# Patient Record
Sex: Female | Born: 1970 | Race: White | Hispanic: Yes | Marital: Married | Smoking: Never smoker
Health system: Southern US, Community
[De-identification: ages and names within clinical notes are randomized; demographics above are authoritative.]

## PROBLEM LIST (undated history)

## (undated) DIAGNOSIS — K589 Irritable bowel syndrome without diarrhea: Secondary | ICD-10-CM

## (undated) DIAGNOSIS — H9319 Tinnitus, unspecified ear: Secondary | ICD-10-CM

## (undated) DIAGNOSIS — E559 Vitamin D deficiency, unspecified: Secondary | ICD-10-CM

## (undated) DIAGNOSIS — M25512 Pain in left shoulder: Secondary | ICD-10-CM

## (undated) DIAGNOSIS — M6530 Trigger finger, unspecified finger: Secondary | ICD-10-CM

## (undated) DIAGNOSIS — M3500 Sicca syndrome, unspecified: Secondary | ICD-10-CM

## (undated) DIAGNOSIS — M542 Cervicalgia: Secondary | ICD-10-CM

## (undated) DIAGNOSIS — F0781 Postconcussional syndrome: Secondary | ICD-10-CM

## (undated) DIAGNOSIS — M329 Systemic lupus erythematosus, unspecified: Secondary | ICD-10-CM

## (undated) DIAGNOSIS — E538 Deficiency of other specified B group vitamins: Secondary | ICD-10-CM

## (undated) DIAGNOSIS — R4586 Emotional lability: Secondary | ICD-10-CM

## (undated) DIAGNOSIS — G8929 Other chronic pain: Secondary | ICD-10-CM

## (undated) DIAGNOSIS — K219 Gastro-esophageal reflux disease without esophagitis: Secondary | ICD-10-CM

## (undated) DIAGNOSIS — Z973 Presence of spectacles and contact lenses: Secondary | ICD-10-CM

## (undated) DIAGNOSIS — J189 Pneumonia, unspecified organism: Secondary | ICD-10-CM

## (undated) DIAGNOSIS — M797 Fibromyalgia: Secondary | ICD-10-CM

## (undated) DIAGNOSIS — R519 Headache, unspecified: Secondary | ICD-10-CM

## (undated) DIAGNOSIS — IMO0002 Reserved for concepts with insufficient information to code with codable children: Secondary | ICD-10-CM

## (undated) HISTORY — PX: COLONOSCOPY: SHX174

## (undated) HISTORY — PX: UPPER GASTROINTESTINAL ENDOSCOPY: SHX188

## (undated) HISTORY — DX: Systemic lupus erythematosus, unspecified: M32.9

## (undated) HISTORY — DX: Fibromyalgia: M79.7

## (undated) HISTORY — DX: Sjogren syndrome, unspecified: M35.00

## (undated) HISTORY — PX: CHOLECYSTECTOMY: SHX55

## (undated) HISTORY — PX: APPENDECTOMY: SHX54

## (undated) HISTORY — DX: Reserved for concepts with insufficient information to code with codable children: IMO0002

## (undated) HISTORY — PX: GALLBLADDER SURGERY: SHX652

---
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
## 2004-07-05 ENCOUNTER — Ambulatory Visit (HOSPITAL_COMMUNITY): Admission: RE | Admit: 2004-07-05 | Discharge: 2004-07-05 | Payer: Self-pay | Admitting: Internal Medicine

## 2004-07-09 ENCOUNTER — Ambulatory Visit (HOSPITAL_COMMUNITY): Admission: RE | Admit: 2004-07-09 | Discharge: 2004-07-09 | Payer: Self-pay | Admitting: Internal Medicine

## 2005-01-24 ENCOUNTER — Ambulatory Visit: Payer: Self-pay | Admitting: Internal Medicine

## 2005-03-03 ENCOUNTER — Ambulatory Visit: Payer: Self-pay | Admitting: Internal Medicine

## 2005-04-27 ENCOUNTER — Ambulatory Visit: Payer: Self-pay | Admitting: Internal Medicine

## 2005-05-11 ENCOUNTER — Ambulatory Visit: Payer: Self-pay | Admitting: Internal Medicine

## 2008-03-11 ENCOUNTER — Other Ambulatory Visit: Admission: RE | Admit: 2008-03-11 | Discharge: 2008-03-11 | Payer: Self-pay | Admitting: Obstetrics and Gynecology

## 2010-12-29 ENCOUNTER — Other Ambulatory Visit: Payer: Self-pay | Admitting: Obstetrics and Gynecology

## 2010-12-29 ENCOUNTER — Other Ambulatory Visit (HOSPITAL_COMMUNITY)
Admission: RE | Admit: 2010-12-29 | Discharge: 2010-12-29 | Disposition: A | Discharge status: Home / Self Care | Payer: BC Managed Care – PPO | Source: Ambulatory Visit | Attending: Obstetrics and Gynecology | Admitting: Obstetrics and Gynecology

## 2010-12-29 DIAGNOSIS — Z01419 Encounter for gynecological examination (general) (routine) without abnormal findings: Secondary | ICD-10-CM | POA: Insufficient documentation

## 2011-04-15 NOTE — Consult Note (Signed)
NAMEMALICIA, [name]:  [id number]   MEDICAL RECORD NO.:  [id number]                  PATIENT TYPE:   LOCATION:                                       FACILITY:   PHYSICIAN:  R. Roetta Sessions, M.D.              DATE OF BIRTH:  11-05-1971   DATE OF CONSULTATION:  07/02/2004  DATE OF DISCHARGE:                                   CONSULTATION   REFERRING PHYSICIAN:  Dr. Wyvonnia Lora.   REASON FOR CONSULTATION:  Epigastric pain.   HISTORY OF PRESENT ILLNESS:  The patient is a 40 year old female who  presents today for further evaluation of chronic intermittent upper  abdominal pain.  She states pain began about four years ago when she was  living in [place].  She was diagnosed with gastritis and duodenitis by EGD per  her report.  She was also told she had H. pylori and underwent treatment  with Pepto-Bismol combination therapy.  She says that this did not really  help.  She continues to have intermittent episodes of epigastric pain.  Over  the last couple of months the pain has been more persistent.  She has pain  in the epigastric region, which she describes as a burning type pain.  It is  worse postprandially, especially with greasy foods.  She feels like her  abdomen swells at times.  She denies any typical heartburn symptoms.  She  has developed some nausea, but no vomiting.  She complains of early satiety.  She has chronic constipation; generally has a bowel movement every two to  three days.  Currently she is not taking any laxatives, although she has  tried different things in the past.  She denies any melena or rectal  bleeding.  She complains of feeling hot in her joints, especially elbows,  wrists, knees and ankles when she develops this abdominal pain.  She  recently was retreated for H. pylori, given positive H. pylori antibody  test.  It was felt that maybe she had inadequate treatment with the first  round four years ago.  She took a 10  day course of Prevpac.  Gallbladder  ultrasound was unremarkable.   CURRENT MEDICATIONS:  None.   ALLERGIES:  SOMETHING USED TO TREAT H. PYLORI FOUR YEARS AGO.   PAST MEDICAL HISTORY:  1. Negative for chronic illnesses.  2. Status post appendectomy.   FAMILY HISTORY:  Father has history of ulcer.  No family history of  colorectal cancer or inflammatory bowel disease.   SOCIAL HISTORY:  She has been married for five years and has a three-year-  old daughter.  She is employed with [employer].  She  has never been a smoker.  Denies any alcohol use.   REVIEW OF SYSTEMS:  Please see HPI for GI.  CARDIOPULMONARY:  Denies any  shortness of breath or chest pain.  GENERAL:  Complains of generalized  fatigue.   PHYSICAL EXAMINATION:  VITAL SIGNS:  Weight 115; height 5 feet 2 inches;  blood pressure 100/60; pulse 72.  GENERAL:  Pleasant, well-nourished, well-developed female in no acute  distress.  SKIN:  Warm and dry; no jaundice.  HEENT:  Conjunctivae are pink; sclerae nonicteric; oropharyngeal mucosa  moist and pink; no lesions, erythema or exudate; no lymphadenopathy,  thyromegaly.  CHEST:  Lungs clear to auscultation.  CARDIAC:  Regular rate and rhythm.  Normal S1, S2.  No murmurs, rubs or  gallops.  ABDOMEN:  Positive bowel sounds.  Soft, nondistended.  She has some moderate  epigastric tenderness to deep palpation.  No organomegaly or masses.  No  rebound tenderness or guarding.  EXTREMITIES:  No edema.   IMPRESSION:  A 40 year old female with chronic epigastric pain, which has  been worse in the last few months.  She has a significant postprandial  component as well, especially to greasy foods.  She is describing nausea and  early satiety.  Symptoms are worrisome for peptic ulcer disease, although I  cannot rule out gastritis, gastroesophageal reflux disease, or even biliary  etiology, such as __________ or dyskinesia.  She has chronic constipation,   currently not on any therapy.   The patient complains of multiple joint soreness, which is intermittent and  she correlates with her abdominal pain.  I discussed with her today that if  this does not improve, especially with treatment of her GI symptoms, would  recommend her to revisit this with Dr. Margo Common and possibly a rheumatological  evaluation.   PLAN:  1. EGD in the near future.  2. Nexium 40 mg daily.  3. FiberChoice two tablets daily.  4. MiraLax 17 grams p.o. q.d. p.r.n. No. 527 grams with one refill given.  5. Further recommendations to follow.     ____________________  R. Roetta Sessions, M.D.     ____________________  Tana Coast, P.A.     ________________________________________  ___________________________________________  Tana Coast, P.A.                         Jonathon Bellows, M.D.   LL/MEDQ  D:  07/02/2004  T:  07/02/2004  Job:  [id number]   cc:   R. Roetta Sessions, M.D.  [address]  [place]  [place] [address]  Fax: [phone]   Wyvonnia Lora  [address]  [place]  [place] [address]  Fax: [phone]

## 2011-04-15 NOTE — Op Note (Signed)
NAME:  Gail [name], Gail [name]                         ACCOUNT NO.:  [id number]   MEDICAL RECORD NO.:  [id number]                   PATIENT TYPE:  AMB   LOCATION:  DAY                                  FACILITY:  [facility]   PHYSICIAN:  R. Roetta Sessions, M.D.              DATE OF BIRTH:  06-27-1971   DATE OF PROCEDURE:  07/05/2004  DATE OF DISCHARGE:                                 OPERATIVE REPORT   PROCEDURE:  Diagnostic esophagogastroduodenoscopy.   INDICATIONS FOR PROCEDURE:  The patient is a 40 year old lady with chronic  epigastric pain and there is a postprandial component.  She was recently  started on fiber of choice, MiraLax and Nexium empirically.  She is  chronically constipated and EGD is now being done.  This approach has been  discussed with the patient at length.  The potential risks, benefits, and  alternatives have been reviewed and questions answered.  Please see  documentation in the medical record for more information.   PROCEDURE NOTE:  O2 saturation, blood pressure, pulse, and respirations were  monitored throughout the entire procedure.   CONSCIOUS SEDATION:  Versed 3 mg IV and Demerol 75 mg IV in divided doses.   INSTRUMENT:  Olympus video chip system.   FINDINGS:  Examination of the tubular esophagus revealed no mucosal  abnormalities.  The EG junction was easily traversed.   Stomach:  The gastric cavity was empty and insufflated well with air and a  thorough examination of the gastric mucosa, including a retroflex view of  the proximal stomach and esophagogastric junction demonstrated no  abnormalities.  The pylorus was patent and easily traversed.  Examination of  the bulb and second portion revealed no abnormalities.   THERAPEUTIC/DIAGNOSTIC MANEUVERS PERFORMED:  None.   The patient tolerated the procedure well and was reacted after endoscopy.   IMPRESSION:  Normal esophagus, stomach and D1/D2.   RECOMMENDATIONS:  1. Continue Nexium, MiraLax and fiber of  choice for the time being.  2. We will proceed with a gallbladder ultrasound to further evaluate this     lady for potential occult gallbladder disease.  3. Further recommendations to follow.      ___________________________________________                                            Jonathon Bellows, M.D.   RMR/MEDQ  D:  07/05/2004  T:  07/05/2004  Job:  [id number]   cc:   Wyvonnia Lora  [address]  [place]  [place] [address]  Fax: [phone]

## 2012-02-22 ENCOUNTER — Other Ambulatory Visit: Payer: Self-pay | Admitting: Obstetrics and Gynecology

## 2012-02-22 ENCOUNTER — Other Ambulatory Visit (HOSPITAL_COMMUNITY)
Admission: RE | Admit: 2012-02-22 | Discharge: 2012-02-22 | Disposition: A | Discharge status: Home / Self Care | Payer: BC Managed Care – PPO | Source: Ambulatory Visit | Attending: Obstetrics and Gynecology | Admitting: Obstetrics and Gynecology

## 2012-02-22 DIAGNOSIS — Z01419 Encounter for gynecological examination (general) (routine) without abnormal findings: Secondary | ICD-10-CM | POA: Insufficient documentation

## 2012-05-10 ENCOUNTER — Ambulatory Visit: Payer: BC Managed Care – PPO | Admitting: Family Medicine

## 2012-05-21 ENCOUNTER — Ambulatory Visit: Payer: BC Managed Care – PPO | Admitting: Family Medicine

## 2012-09-04 ENCOUNTER — Ambulatory Visit: Payer: BC Managed Care – PPO | Admitting: Family Medicine

## 2012-10-08 ENCOUNTER — Ambulatory Visit: Payer: BC Managed Care – PPO | Admitting: Family Medicine

## 2013-04-18 ENCOUNTER — Other Ambulatory Visit: Payer: Self-pay | Admitting: Obstetrics and Gynecology

## 2013-05-03 ENCOUNTER — Encounter: Payer: Self-pay | Admitting: *Deleted

## 2013-05-06 ENCOUNTER — Other Ambulatory Visit (HOSPITAL_COMMUNITY)
Admission: RE | Admit: 2013-05-06 | Discharge: 2013-05-06 | Disposition: A | Discharge status: Home / Self Care | Payer: BC Managed Care – PPO | Source: Ambulatory Visit | Attending: Obstetrics and Gynecology | Admitting: Obstetrics and Gynecology

## 2013-05-06 ENCOUNTER — Ambulatory Visit (INDEPENDENT_AMBULATORY_CARE_PROVIDER_SITE_OTHER): Payer: BC Managed Care – PPO | Admitting: Obstetrics and Gynecology

## 2013-05-06 ENCOUNTER — Encounter: Payer: Self-pay | Admitting: Obstetrics and Gynecology

## 2013-05-06 VITALS — BP 104/70 | Ht 60.5 in | Wt 116.2 lb

## 2013-05-06 DIAGNOSIS — Z01419 Encounter for gynecological examination (general) (routine) without abnormal findings: Secondary | ICD-10-CM

## 2013-05-06 DIAGNOSIS — Z113 Encounter for screening for infections with a predominantly sexual mode of transmission: Secondary | ICD-10-CM | POA: Insufficient documentation

## 2013-05-06 DIAGNOSIS — N760 Acute vaginitis: Secondary | ICD-10-CM | POA: Insufficient documentation

## 2013-05-06 DIAGNOSIS — B9689 Other specified bacterial agents as the cause of diseases classified elsewhere: Secondary | ICD-10-CM

## 2013-05-06 DIAGNOSIS — R319 Hematuria, unspecified: Secondary | ICD-10-CM

## 2013-05-06 DIAGNOSIS — Z1151 Encounter for screening for human papillomavirus (HPV): Secondary | ICD-10-CM | POA: Insufficient documentation

## 2013-05-06 LAB — POCT URINALYSIS DIPSTICK
Glucose, UA: NEGATIVE
Ketones, UA: NEGATIVE
Leukocytes, UA: NEGATIVE

## 2013-05-06 MED ORDER — METRONIDAZOLE 0.75 % VA GEL: 1.0000 | Freq: Every day | VAGINAL | Status: DC

## 2013-05-06 NOTE — Patient Instructions (Addendum)
Bacterial Vaginosis Bacterial vaginosis (BV) is a vaginal infection where the normal balance of bacteria in the vagina is disrupted. The normal balance is then replaced by an overgrowth of certain bacteria. There are several different kinds of bacteria that can cause BV. BV is the most common vaginal infection in women of childbearing age. CAUSES   The cause of BV is not fully understood. BV develops when there is an increase or imbalance of harmful bacteria.  Some activities or behaviors can upset the normal balance of bacteria in the vagina and put women at increased risk including:  Having a new sex partner or multiple sex partners.  Douching.  Using an intrauterine device (IUD) for contraception.  It is not clear what role sexual activity plays in the development of BV. However, women that have never had sexual intercourse are rarely infected with BV. Women do not get BV from toilet seats, bedding, swimming pools or from touching objects around them.  SYMPTOMS   Grey vaginal discharge.  A fish-like odor with discharge, especially after sexual intercourse.  Itching or burning of the vagina and vulva.  Burning or pain with urination.  Some women have no signs or symptoms at all. DIAGNOSIS  Your caregiver must examine the vagina for signs of BV. Your caregiver will perform lab tests and look at the sample of vaginal fluid through a microscope. They will look for bacteria and abnormal cells (clue cells), a pH test higher than 4.5, and a positive amine test all associated with BV.  RISKS AND COMPLICATIONS   Pelvic inflammatory disease (PID).  Infections following gynecology surgery.  Developing HIV.  Developing herpes virus. TREATMENT  Sometimes BV will clear up without treatment. However, all women with symptoms of BV should be treated to avoid complications, especially if gynecology surgery is planned. Female partners generally do not need to be treated. However, BV may spread  between female sex partners so treatment is helpful in preventing a recurrence of BV.   BV may be treated with antibiotics. The antibiotics come in either pill or vaginal cream forms. Either can be used with nonpregnant or pregnant women, but the recommended dosages differ. These antibiotics are not harmful to the baby.  BV can recur after treatment. If this happens, a second round of antibiotics will often be prescribed.  Treatment is important for pregnant women. If not treated, BV can cause a premature delivery, especially for a pregnant woman who had a premature birth in the past. All pregnant women who have symptoms of BV should be checked and treated.  For chronic reoccurrence of BV, treatment with a type of prescribed gel vaginally twice a week is helpful. HOME CARE INSTRUCTIONS   Finish all medication as directed by your caregiver.  Do not have sex until treatment is completed.  Tell your sexual partner that you have a vaginal infection. They should see their caregiver and be treated if they have problems, such as a mild rash or itching.  Practice safe sex. Use condoms. Only have 1 sex partner. PREVENTION  Basic prevention steps can help reduce the risk of upsetting the natural balance of bacteria in the vagina and developing BV:  Do not have sexual intercourse (be abstinent).  Do not douche.  Use all of the medicine prescribed for treatment of BV, even if the signs and symptoms go away.  Tell your sex partner if you have BV. That way, they can be treated, if needed, to prevent reoccurrence. SEEK MEDICAL CARE IF:     Your symptoms are not improving after 3 days of treatment.  You have increased discharge, pain, or fever. MAKE SURE YOU:   Understand these instructions.  Will watch your condition.  Will get help right away if you are not doing well or get worse. FOR MORE INFORMATION  Division of STD Prevention (DSTDP), Centers for Disease Control and Prevention:  www.cdc.gov/std American Social Health Association (ASHA): www.ashastd.org  Document Released: 11/14/2005 Document Revised: 02/06/2012 Document Reviewed: 05/07/2009 ExitCare Patient Information 2014 [employer].  

## 2013-05-06 NOTE — Progress Notes (Signed)
Subjective:    Gail [name] is a 42 y.o. female, G1P1, who presents for annual visit. Her only complaint is slight vaginal odor and discharge. Additionally she has some concern of attendance in the left breast. She's had ultrasounds in the past which were negative but no mammograms.   The following portions of the patient's history were reviewed and updated as appropriate: allergies, current medications, past family history.  Review of Systems Pertinent items are noted in HPI. Breast:Negative for breast lump,nipple discharge or nipple retraction Gastrointestinal: Negative for abdominal pain, change in bowel habits or rectal bleeding Urinary:negative   Objective:    BP 104/70  Ht 5' 0.5" (1.537 m)  Wt 116 lb 3.2 oz (52.708 kg)  BMI 22.31 kg/m2    Weight:  Wt Readings from Last 1 Encounters:  05/06/13 116 lb 3.2 oz (52.708 kg)          BMI: Body mass index is 22.31 kg/(m^2).  General Appearance: Alert, appropriate appearance for age. No acute distress GYN exam:Physical Examination: General appearance - alert, well appearing, and in no distress, oriented to person, place, and time and normal appearing weight Mental status - alert, oriented to person, place, and time, normal mood, behavior, speech, dress, motor activity, and thought processes Abdomen - soft, nontender, nondistended, no masses or organomegaly Breasts - breasts appear normal, no suspicious masses, no skin or nipple changes or axillary nodes Pelvic - normal external genitalia, vulva, vagina, cervix, uterus and adnexa Extremities - peripheral pulses normal, no pedal edema, no clubbing or cyanosis   Assessment:    Normal gyn exam History consistent with intermittent bacterial vaginosis    Plan:    mammogram pap smear return annually or prn Prescription for MetroGel to be used once monthly instead of continuously with 2 refill Referred to Dr. Malvin Johns for hemorrhoidal concerns Gail Burrow, MD

## 2013-09-24 ENCOUNTER — Telehealth: Payer: Self-pay | Admitting: Obstetrics and Gynecology

## 2013-09-24 NOTE — Telephone Encounter (Signed)
Left message x 1. JSY 

## 2013-09-24 NOTE — Telephone Encounter (Signed)
Spoke with pt. Was put on Metrogel for burning in vagina. Now having pressure, cramps, and pain in left side. Call transferred to front desk to schedule an appt for recheck. JSY

## 2013-10-03 ENCOUNTER — Ambulatory Visit (INDEPENDENT_AMBULATORY_CARE_PROVIDER_SITE_OTHER): Payer: BC Managed Care – PPO | Admitting: Obstetrics and Gynecology

## 2013-10-03 ENCOUNTER — Encounter (INDEPENDENT_AMBULATORY_CARE_PROVIDER_SITE_OTHER): Payer: Self-pay

## 2013-10-03 ENCOUNTER — Encounter: Payer: Self-pay | Admitting: Obstetrics and Gynecology

## 2013-10-03 VITALS — BP 100/60 | Ht 62.0 in | Wt 117.8 lb

## 2013-10-03 DIAGNOSIS — N898 Other specified noninflammatory disorders of vagina: Secondary | ICD-10-CM

## 2013-10-03 DIAGNOSIS — R3915 Urgency of urination: Secondary | ICD-10-CM

## 2013-10-03 LAB — POCT URINALYSIS DIPSTICK
Blood, UA: NEGATIVE
Glucose, UA: NEGATIVE
Ketones, UA: NEGATIVE
Leukocytes, UA: NEGATIVE
Nitrite, UA: NEGATIVE

## 2013-10-03 NOTE — Progress Notes (Signed)
   [facility] Visit  Patient name: Gail [name] MRN [id number]  Date of birth: 05/01/1971  CC & HPI:  Gail [name] is a 42 y.o. female presenting today for llq pain, noted 10/23-24. Awoke pt. Period came early. Pain still present, less severe  Noted intermittently. Used metrogel rx;d earlier this  summer p menses.  Primary care Meredith Mody MD, to see pt today for results of blood work  ROS:  No fever chills,  Complains of recurrent leukorrhea, with greenish discharge, and has been treated at Dr Tenna Delaine urgent care for ? Pid, with negative cultures, will get copies of record. Lmp last week, Early, 10/27-28 lasted3 days. She noticing cracking and bleeding at the site of her old episiotomy and sub-clitoral irritation Patient's husband has recently lost significant amount of weight with significant increase in libido and sexual frequency     Medical & Surgical Hx:  Reviewed: Significant for  Medications: Reviewed & Updated - see associated section Social History: Reviewed -  reports that she has never smoked. She has never used smokeless tobacco. Fam Hx: husband has lost lots of weigh and in much more sexually active with pt, resulting in increased dyspareunia, near urethra, below clitoris, lubricants reviewed. Objective Findings:  Vitals: BP 100/60  Ht 5\' 2"  (1.575 m)  Wt 117 lb 12.8 oz (53.434 kg)  BMI 21.54 kg/m2  LMP 09/24/2013  Physical Examination: General appearance - alert, well appearing, and in no distress, oriented to person, place, and time and normal appearing weight Abdomen - soft, nontender, nondistended, no masses or organomegaly Pelvic - normal external genitalia, vulva, vagina, cervix, uterus and adnexa Cervix with significant leukorrhea, everted Recently cultured at dr Guarino's office, will have pt sign ROI  Assessment & Plan:   Heavy cervical discharge , persistent,  Negative sti tests recently Plan ;get record Dr Wende Crease and Meredith Mody.

## 2013-10-03 NOTE — Patient Instructions (Addendum)
Cornhuskers lotion Adam and EVE individual packet lubricants Luvena (at drugstore)  Please sign the request for records from Dr. Wende Crease Return to 11-12 days for an ultrasound, review of records and further discussion

## 2013-10-10 ENCOUNTER — Other Ambulatory Visit: Payer: Self-pay | Admitting: Obstetrics and Gynecology

## 2013-10-10 ENCOUNTER — Other Ambulatory Visit: Payer: Self-pay | Admitting: Obstetrics & Gynecology

## 2013-10-10 DIAGNOSIS — N949 Unspecified condition associated with female genital organs and menstrual cycle: Secondary | ICD-10-CM

## 2013-10-14 ENCOUNTER — Ambulatory Visit (INDEPENDENT_AMBULATORY_CARE_PROVIDER_SITE_OTHER): Payer: BC Managed Care – PPO | Admitting: Obstetrics and Gynecology

## 2013-10-14 ENCOUNTER — Ambulatory Visit (INDEPENDENT_AMBULATORY_CARE_PROVIDER_SITE_OTHER): Payer: BC Managed Care – PPO

## 2013-10-14 ENCOUNTER — Encounter: Payer: Self-pay | Admitting: Obstetrics and Gynecology

## 2013-10-14 VITALS — BP 102/60 | Ht 62.0 in | Wt 118.8 lb

## 2013-10-14 DIAGNOSIS — N949 Unspecified condition associated with female genital organs and menstrual cycle: Secondary | ICD-10-CM

## 2013-10-14 DIAGNOSIS — G8929 Other chronic pain: Secondary | ICD-10-CM

## 2013-10-14 DIAGNOSIS — R1032 Left lower quadrant pain: Secondary | ICD-10-CM

## 2013-10-14 NOTE — Progress Notes (Signed)
followup of lower abd pain noted at last visit, slightly more on Left,now nearly gone. Pt wonders if llq pain and pressure could have been related to metronidazole. No fever, + h/a,  Labs reviewed, Gc/ Chl negative  Pt still has excessive discharge and is concerned over yellowish discharge.  Cryocautery for chronic leukorrhea discussed.

## 2013-10-14 NOTE — Patient Instructions (Signed)
Please go to WebMD to research chronic cervical discharge, and cryocautery.

## 2014-09-29 ENCOUNTER — Encounter: Payer: Self-pay | Admitting: Obstetrics and Gynecology

## 2015-04-29 ENCOUNTER — Other Ambulatory Visit: Payer: BC Managed Care – PPO | Admitting: Obstetrics & Gynecology

## 2015-05-04 ENCOUNTER — Other Ambulatory Visit (HOSPITAL_COMMUNITY)
Admission: RE | Admit: 2015-05-04 | Discharge: 2015-05-04 | Disposition: A | Discharge status: Home / Self Care | Payer: BC Managed Care – PPO | Source: Ambulatory Visit | Attending: Obstetrics and Gynecology | Admitting: Obstetrics and Gynecology

## 2015-05-04 ENCOUNTER — Encounter: Payer: Self-pay | Admitting: Obstetrics and Gynecology

## 2015-05-04 ENCOUNTER — Ambulatory Visit (INDEPENDENT_AMBULATORY_CARE_PROVIDER_SITE_OTHER): Payer: BC Managed Care – PPO | Admitting: Obstetrics and Gynecology

## 2015-05-04 VITALS — BP 112/64 | HR 76 | Ht 59.75 in | Wt 120.0 lb

## 2015-05-04 DIAGNOSIS — Z1151 Encounter for screening for human papillomavirus (HPV): Secondary | ICD-10-CM | POA: Insufficient documentation

## 2015-05-04 DIAGNOSIS — Z01419 Encounter for gynecological examination (general) (routine) without abnormal findings: Secondary | ICD-10-CM | POA: Insufficient documentation

## 2015-05-04 DIAGNOSIS — N898 Other specified noninflammatory disorders of vagina: Secondary | ICD-10-CM

## 2015-05-04 DIAGNOSIS — Z113 Encounter for screening for infections with a predominantly sexual mode of transmission: Secondary | ICD-10-CM | POA: Diagnosis present

## 2015-05-04 DIAGNOSIS — R300 Dysuria: Secondary | ICD-10-CM

## 2015-05-04 DIAGNOSIS — K5902 Outlet dysfunction constipation: Secondary | ICD-10-CM | POA: Insufficient documentation

## 2015-05-04 LAB — POCT URINALYSIS DIPSTICK
GLUCOSE UA: NEGATIVE
KETONES UA: NEGATIVE
NITRITE UA: NEGATIVE
PROTEIN UA: NEGATIVE
RBC UA: NEGATIVE

## 2015-05-04 NOTE — Progress Notes (Addendum)
Patient ID: Gail [name], female   DOB: 05/20/1971, 44 y.o.   MRN: [id number]   Assessment:  Annual Gyn Exam 1.  Chronic leukorrhea, cervical eversion 2. Constipation vs small rectocele Plan:  2. pap smear done, next pap due 2 years 3. return annually or prn 3    Annual mammogram advised 4. Pt not interested in LEEP at this time Subjective:  Gail [name] is a 44 y.o. female G1P1 who presents for annual exam. Patient's last menstrual period was 04/15/2015. The patient, G1P1, has complaints today of constant, moderate, cramping suprapubic pain that occurs after intercourse and started several weeks ago. She notes some bladder urge after intercourse and yellow-green vaginal discharge as associated symptoms. Pt states that she sometimes does not notice urgency or does completely empty bladder when voiding. She also reports that her eyes and mouth have been dry, but that vaginal secretions continue. Pt denies dysuria.  Pt notes intermittent constipation and straining with BM. She reports that her husband identified a small hemorrhoid. Pt has 1 small BM daily. She notes small fluid intake.   The following portions of the patient's history were reviewed and updated as appropriate: allergies, current medications, past family history, past medical history, past social history, past surgical history and problem list. Past Medical History  Diagnosis Date  . Lupus   . Sjogren's disease     Past Surgical History  Procedure Laterality Date  . Appendectomy    . Gallbladder surgery       Current outpatient prescriptions:  .  cyanocobalamin (,VITAMIN B-12,) 1000 MCG/ML injection, , Disp: , Rfl:  .  FLUOCINOLONE ACETONIDE SCALP 0.01 % OIL, Apply 1 application topically once a week. , Disp: , Rfl:  .  metroNIDAZOLE (METROGEL) 0.75 % vaginal gel, Place 1 Applicatorful vaginally at bedtime. Apply one applicatorful to vagina at bedtime once monthly when vaginal discharge noted or odor (Patient not  taking: Reported on 05/04/2015), Disp: 70 g, Rfl: 3  Review of Systems Constitutional: negative   Gastrointestinal: negative Genitourinary: negative  Objective:  BP 112/64 mmHg  Pulse 76  Ht 4' 11.75" (1.518 m)  Wt 120 lb (54.432 kg)  BMI 23.62 kg/m2  LMP 04/15/2015   BMI: Body mass index is 23.62 kg/(m^2).  General Appearance: Alert, appropriate appearance for age. No acute distress HEENT: Grossly normal Neck / Thyroid:  Cardiovascular: RRR; normal S1, S2, no murmur Lungs: CTA bilaterally Back: No CVAT Breast Exam: No dimpling, nipple retraction or discharge. No masses or nodes., Normal to inspection and No masses or nodes.No dimpling, nipple retraction or discharge. Gastrointestinal: Soft, non-tender, no masses or organomegaly Pelvic Exam: Vulva and vagina appear normal. Bimanual exam reveals normal uterus and adnexa. External genitalia: normal general appearance Vaginal: normal mucosa without prolapse or lesions and normal without tenderness, induration or masses Cervix: normal appearance and increased secretions Adnexa: normal bimanual exam Uterus: normal single, nontender  Bladder: 10.1 x 4.8 x 6.2 Rectovaginal: good tone, Smallrectocele pouch high above sphincter and guaiac negative stool obtained Lymphatic Exam: Non-palpable nodes in neck, clavicular, axillary, or inguinal regions  Skin: no rash or abnormalities Neurologic: Normal gait and speech, no tremor  Psychiatric: Alert and oriented, appropriate affect.  Urinalysis:leukocyte esterase Hemoccult: Negative  Mallory Shirk. MD Pgr [phone] 2:47 PM    This chart was scribed for Mallory Shirk, MD by Tula Nakayama, ED Scribe. This patient was seen in room 2 and the patient's care was started at 2:47 PM.   I personally performed the services  described in this documentation, which was SCRIBED in my presence. The recorded information has been reviewed and considered accurate. It has been edited as necessary  during review. Jonnie Kind, MD

## 2015-05-11 LAB — CYTOLOGY - PAP

## 2016-10-14 ENCOUNTER — Encounter (INDEPENDENT_AMBULATORY_CARE_PROVIDER_SITE_OTHER): Payer: Self-pay | Admitting: Internal Medicine

## 2016-10-26 ENCOUNTER — Ambulatory Visit (INDEPENDENT_AMBULATORY_CARE_PROVIDER_SITE_OTHER): Payer: BC Managed Care – PPO | Admitting: Internal Medicine

## 2017-02-07 ENCOUNTER — Encounter (INDEPENDENT_AMBULATORY_CARE_PROVIDER_SITE_OTHER): Payer: Self-pay | Admitting: Internal Medicine

## 2017-02-07 ENCOUNTER — Ambulatory Visit (INDEPENDENT_AMBULATORY_CARE_PROVIDER_SITE_OTHER): Payer: BC Managed Care – PPO | Admitting: Internal Medicine

## 2017-02-07 ENCOUNTER — Encounter (INDEPENDENT_AMBULATORY_CARE_PROVIDER_SITE_OTHER): Payer: Self-pay | Admitting: *Deleted

## 2017-02-07 ENCOUNTER — Other Ambulatory Visit (INDEPENDENT_AMBULATORY_CARE_PROVIDER_SITE_OTHER): Payer: Self-pay | Admitting: *Deleted

## 2017-02-07 ENCOUNTER — Other Ambulatory Visit (INDEPENDENT_AMBULATORY_CARE_PROVIDER_SITE_OTHER): Payer: Self-pay | Admitting: Internal Medicine

## 2017-02-07 VITALS — BP 90/73 | HR 66 | Temp 97.8°F | Resp 18 | Ht 61.0 in | Wt 123.3 lb

## 2017-02-07 DIAGNOSIS — R1013 Epigastric pain: Secondary | ICD-10-CM | POA: Diagnosis not present

## 2017-02-07 DIAGNOSIS — K219 Gastro-esophageal reflux disease without esophagitis: Secondary | ICD-10-CM

## 2017-02-07 DIAGNOSIS — R140 Abdominal distension (gaseous): Secondary | ICD-10-CM

## 2017-02-07 DIAGNOSIS — K5900 Constipation, unspecified: Secondary | ICD-10-CM | POA: Diagnosis not present

## 2017-02-07 DIAGNOSIS — K5902 Outlet dysfunction constipation: Secondary | ICD-10-CM

## 2017-02-07 MED ORDER — LINACLOTIDE 72 MCG PO CAPS: 72.0000 ug | ORAL_CAPSULE | Freq: Every day | ORAL | 5 refills | Status: DC

## 2017-02-07 MED ORDER — PANTOPRAZOLE SODIUM 40 MG PO TBEC: 40.0000 mg | DELAYED_RELEASE_TABLET | Freq: Every day | ORAL | 5 refills | Status: DC

## 2017-02-07 NOTE — Patient Instructions (Addendum)
Upper GI with small bowel follow-through to be scheduled. Physician will call with results of tests when completed. Keep symptom diary as to frequency and consistency of stools and when you have to use glycerin or Dulcolax suppository. Can you suppository every other day on as-needed basis. Gradually increase intake of fiber rich foods.

## 2017-02-07 NOTE — Progress Notes (Addendum)
Presenting complaint;  Epigastric pain and bloating and constipation.  History of present illness:  Patient is 46 year old Hispanic female who is referred through courtesy of Dr. Roylene Reason for GI evaluation. She has multiple symptoms. She complains of constipation. She has had constipation for several years. She has tried various remedies but none has worked to her satisfaction. She she used to take Fensopar she was living in [place]. She has tried Dulcolax Amitiza MiraLAX as well as stool softeners and Fleet enema. These medications do not work or if they do it is only for a few days. Linzess resulted in diarrhea. She does not remember the dose she took. She has gone as many as 4 days without a bowel movement. She feels if she skips one day she feels miserable. She never has good evacuation. She also has urge and at times unable to have bowel movement. She has been evaluated by Dr. Glo Herring and told she had small rectocele. She states she has hemorrhoids and every now and then notices small amount of blood with her bowel movements. She had normal colonoscopy in October.2006. She also complains of bloating. She states bloating started following appendectomy when she was 46 years old. She states bloating never goes away completely. She feels less bloated when she is fasting. She has noted more bloating with certain foods such as gr and fruits. Complains of frequent burping. She does not pass excessive flatus. Lately she has had lump in her throat and intermittent heartburn. Also complains of burning epigastric pain. This pain does not go away with bowel movement. She does not have good appetite but she has not lost any weight recently. She wonders if she has gluten allergy.    Current Medications: Outpatient Encounter Prescriptions as of 02/07/2017  Medication Sig  . cyanocobalamin (,VITAMIN B-12,) 1000 MCG/ML injection Inject 100 mcg into the muscle. Patient 's last dose was last month.  . Vitamin D,  Ergocalciferol, (DRISDOL) 50000 units CAPS capsule Take 50,000 Units by mouth every 7 (seven) days. Patient will start taking medication.  . [DISCONTINUED] FLUOCINOLONE ACETONIDE SCALP 0.01 % OIL Apply 1 application topically once a week.   . [DISCONTINUED] metroNIDAZOLE (METROGEL) 0.75 % vaginal gel Place 1 Applicatorful vaginally at bedtime. Apply one applicatorful to vagina at bedtime once monthly when vaginal discharge noted or odor (Patient not taking: Reported on 05/04/2015)   No facility-administered encounter medications on file as of 02/07/2017.    Past medical history:  Appendectomy at age 29. Normal EGD in August 2005 performed for chronic epigastric pain. Cholecystectomy in January 2006. Chronic constipation/IBS. Normal colonoscopy in October 2006. Fibromyalgia diagnosed in 2017([facility]). Sjogren syndrome diagnosed in 2017([facility]). Vitamin D deficiency. History of B12 deficiency.   Allergies: No Known Allergies  Family history:  Father is 53 years old and has unknown stomach problems. Mother is 3 and in good health. She has a sister age 50 with hypothyroidism. As 2 other sisters and they have constipation. She has a brother age 64 in good health.   Social history: She is married and has one daughter age 10 who is being evaluated at [facility] for stomach problems. She moved to [place] from [place] in 2000. 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 She does not smoke cigarettes or drink alcohol. She does not do regular physical activity or exercise.    Physical examination: Blood pressure 90/73, pulse 66, temperature 97.8 F (36.6 C), temperature source Oral, resp. rate 18, height 5\' 1"  (1.549 m), weight  123 lb 4.8 oz (55.9 kg). Patient is alert and in no acute distress. Conjunctiva is pink. Sclera is nonicteric Oropharyngeal mucosa is normal. No neck masses or thyromegaly noted. Cardiac exam with regular rhythm normal S1 and S2. No murmur or gallop  noted. Lungs are clear to auscultation. Abdomen is symmetrical and not distended. Bowel sounds are normal. On palpation abdomen is soft with mild midepigastric tenderness. No organomegaly or masses.  No LE edema or clubbing noted.  Labs/studies Results: Lab data from 11/17/2016  CBC 3.1, H&H 11.9 and 34.5 and platelet count 253K. H. pylori serology negative Glucose 82 BUN 10 and creatinine 0.44 Serum sodium 138, potassium 4.3, chloride 101, CO2 24 Serum calcium 8.6 Bilirubin 0.4, AP 59, AST 17, ALT 13, total protein 7.1 and albumin of 4.2.  Serum B12    812   TSH 1.220  Vit D 19.2.    Assessment:  #1. Chronic constipation. She had normal colonoscopy over 10 years ago. She has tried various remedies but nothing has worked to her satisfaction. Exam she is not distended but she feels bloated. Therefore she may have sensitive enteric system or hyperalgesia. She therefore could have constipation predominant IBS. #2. Bloating. Given history of Sjogren syndrome will assess for small bowel disease as well as celiac disease. #3. Epigastric pain. She has had this pain for years. EGD 12 years ago was normal. Gallbladder has been removed and recent LFTs were normal. #4. GERD.   Recommendations:  Upper GI with small bowel follow-through. Celiac disease panel. Pantoprazole 40 mg by mouth every morning. Linzess 72 mcg by mouth every morning. Patient advised to increase intake of fiber rich foods gradually. Patient advised to keep symptom and stool diary until office visit in 6 weeks. She can use glycerin or Dulcolax suppository on as-needed basis. Patient also encouraged to exercise or walk at least 3 times a week or more frequently if possible. She should do activity for at least 30 minutes each time.

## 2017-02-10 ENCOUNTER — Other Ambulatory Visit (HOSPITAL_COMMUNITY): Payer: BC Managed Care – PPO

## 2017-02-12 LAB — CELIAC PNL 2 RFLX ENDOMYSIAL AB TTR
(TTG) AB, IGG: 3 U/mL
(tTG) Ab, IgA: <1 U/mL
Endomysial Ab IgA: NEGATIVE
GLIADIN(DEAM) AB,IGA: 4 U (ref <20)
Gliadin(Deam) Ab,IgG: 2 U (ref <20)
Immunoglobulin A: 177 mg/dL (ref 81–463)

## 2017-02-28 ENCOUNTER — Ambulatory Visit (HOSPITAL_COMMUNITY): Payer: BC Managed Care – PPO

## 2017-03-10 ENCOUNTER — Telehealth (INDEPENDENT_AMBULATORY_CARE_PROVIDER_SITE_OTHER): Payer: Self-pay | Admitting: Internal Medicine

## 2017-03-10 NOTE — Telephone Encounter (Signed)
Patient called and requested to cancel her follow up appointment with Dr. Laural Golden on 03/14/17.  I had a cancellation for May and offered her that, but she stated that she was doing good and wanted to push it out.  I scheduled her for 06/13/17 at 10:30am.  The patient stated that she's not taking the medication Dr. Laural Golden gave her, but she changed her diet and it seems to be working.

## 2017-03-14 ENCOUNTER — Ambulatory Visit (INDEPENDENT_AMBULATORY_CARE_PROVIDER_SITE_OTHER): Payer: BC Managed Care – PPO | Admitting: Internal Medicine

## 2017-04-12 ENCOUNTER — Ambulatory Visit (INDEPENDENT_AMBULATORY_CARE_PROVIDER_SITE_OTHER): Payer: BC Managed Care – PPO | Admitting: Obstetrics and Gynecology

## 2017-04-12 ENCOUNTER — Encounter: Payer: Self-pay | Admitting: Obstetrics and Gynecology

## 2017-04-12 VITALS — BP 112/66 | HR 61 | Ht 61.0 in | Wt 120.0 lb

## 2017-04-12 DIAGNOSIS — Z01419 Encounter for gynecological examination (general) (routine) without abnormal findings: Secondary | ICD-10-CM

## 2017-04-12 NOTE — Progress Notes (Signed)
Pt here for PAP but power went out at facility. Pt stated she couldn't wait any longer. PAP order cancelled and pt rescheduled visit for another time

## 2017-04-13 ENCOUNTER — Encounter (INDEPENDENT_AMBULATORY_CARE_PROVIDER_SITE_OTHER): Payer: Self-pay

## 2017-05-01 ENCOUNTER — Other Ambulatory Visit: Payer: BC Managed Care – PPO | Admitting: Obstetrics and Gynecology

## 2017-05-03 ENCOUNTER — Encounter: Payer: Self-pay | Admitting: Obstetrics and Gynecology

## 2017-05-03 ENCOUNTER — Other Ambulatory Visit (HOSPITAL_COMMUNITY)
Admission: RE | Admit: 2017-05-03 | Discharge: 2017-05-03 | Disposition: A | Discharge status: Home / Self Care | Payer: BC Managed Care – PPO | Source: Ambulatory Visit | Attending: Obstetrics and Gynecology | Admitting: Obstetrics and Gynecology

## 2017-05-03 ENCOUNTER — Ambulatory Visit (INDEPENDENT_AMBULATORY_CARE_PROVIDER_SITE_OTHER): Payer: BC Managed Care – PPO | Admitting: Obstetrics and Gynecology

## 2017-05-03 VITALS — BP 98/54 | HR 68 | Ht 61.0 in | Wt 121.0 lb

## 2017-05-03 DIAGNOSIS — Z01419 Encounter for gynecological examination (general) (routine) without abnormal findings: Secondary | ICD-10-CM | POA: Diagnosis not present

## 2017-05-03 NOTE — Progress Notes (Signed)
Patient ID: Gail [name], female   DOB: 1971/07/17, 46 y.o.   MRN: [id number]   Assessment:  Annual Gyn Exam Rectocele - Discussed posterior repair Non concerning breast exam    Plan:  1. pap smear done, next pap due in 3-5 yrs 2. return annually or prn 3    Annual mammogram and regular self exams advised 4. Pt to consider posterior repair and return PRN  Subjective:   Chief Complaint  Patient presents with  . Annual Exam    constipation and rt side rectal pain     Gail [name] is a 46 y.o. female G1P1 who presents for annual exam. Patient's last menstrual period was 03/29/2017. The patient has complaints today of right-sided vaginal pain for the last month in addition to persistent issues defecating secondary to known rectocele. Pt states she finds relief with splinting, but finds inadequate relief with stool softeners.   Discussion: 1. Discussed with pt risks and benefits of posterior repair of rectocele.   At end of discussion, pt had opportunity to ask questions and has no further questions at this time.   Specific discussion of rectocele repair as noted above. Greater than 50% was spent in counseling and coordination of care with the patient.   Total time greater than: 25 minutes.    She also c/o recurrent pain to the left breast, which has previously been evaluated with U/S. She has not recently gotten a mammogram.   The following portions of the patient's history were reviewed and updated as appropriate: allergies, current medications, past family history, past medical history, past social history, past surgical history and problem list. Past Medical History:  Diagnosis Date  . Fibromyalgia   . Lupus   . Sjogren's disease [facility])     Past Surgical History:  Procedure Laterality Date  . APPENDECTOMY    . CHOLECYSTECTOMY    . COLONOSCOPY    . GALLBLADDER SURGERY    . UPPER GASTROINTESTINAL ENDOSCOPY       Current Outpatient Prescriptions:  .  linaclotide  (LINZESS) 72 MCG capsule, Take 1 capsule (72 mcg total) by mouth daily before breakfast., Disp: 30 capsule, Rfl: 5 .  cyanocobalamin (,VITAMIN B-12,) 1000 MCG/ML injection, Inject 100 mcg into the muscle. Patient 's last dose was last month., Disp: , Rfl:  .  pantoprazole (PROTONIX) 40 MG tablet, Take 1 tablet (40 mg total) by mouth daily before breakfast. (Patient not taking: Reported on 04/12/2017), Disp: 30 tablet, Rfl: 5 .  Vitamin D, Ergocalciferol, (DRISDOL) 50000 units CAPS capsule, Take 50,000 Units by mouth every 7 (seven) days. Patient will start taking medication., Disp: , Rfl:   Review of Systems Constitutional: negative Gastrointestinal: positive for constipation Genitourinary: positive for L breast pain, R vaginal pain , decreased intimacy due to partners progressing impotence and weight gain.  Objective:  BP (!) 98/54 (BP Location: Right Arm, Patient Position: Sitting, Cuff Size: Normal)   Pulse 68   Ht 5\' 1"  (1.549 m)   Wt 121 lb (54.9 kg)   LMP 03/29/2017   BMI 22.86 kg/m    BMI: Body mass index is 22.86 kg/m.  General Appearance: Alert, appropriate appearance for age. No acute distress HEENT: Grossly normal Neck / Thyroid:  Cardiovascular: RRR; normal S1, S2, no murmur Lungs: CTA bilaterally Back: No CVAT Breast Exam: No masses or nodes.No dimpling, nipple retraction or discharge. Firmer tissues on the left. No suspicious masses.  Gastrointestinal: Soft, non-tender, no masses or organomegaly Pelvic Exam: External genitalia: normal  general appearance Vaginal: normal mucosa without prolapse or lesions Cervix: normal appearance Adnexa: normal bimanual exam Uterus: normal single, nontender Rectal: rectocele largely unchanged, a small fingertip sized defect in midline above intact sphincter. Lymphatic Exam: Non-palpable nodes in neck, clavicular, axillary, or inguinal regions  Skin: no rash or abnormalities Neurologic: Normal gait and speech, no tremor  Psychiatric:  Alert and oriented, appropriate affect.  Urinalysis:Not done  Mallory Shirk. MD Pgr [phone] 3:09 PM     By signing my name below, I, Hansel Feinstein, attest that this documentation has been prepared under the direction and in the presence of Jonnie Kind, MD. Electronically Signed: Hansel Feinstein, ED Scribe. 05/03/17. 3:09 PM.  I personally performed the services described in this documentation, which was SCRIBED in my presence. The recorded information has been reviewed and considered accurate. It has been edited as necessary during review. Jonnie Kind, MD

## 2017-05-09 LAB — CYTOLOGY - PAP
Diagnosis: NEGATIVE
HPV: NOT DETECTED

## 2017-06-13 ENCOUNTER — Ambulatory Visit (INDEPENDENT_AMBULATORY_CARE_PROVIDER_SITE_OTHER): Payer: BC Managed Care – PPO | Admitting: Internal Medicine

## 2017-07-03 ENCOUNTER — Ambulatory Visit (INDEPENDENT_AMBULATORY_CARE_PROVIDER_SITE_OTHER): Payer: BC Managed Care – PPO | Admitting: Otolaryngology

## 2017-07-03 DIAGNOSIS — H93293 Other abnormal auditory perceptions, bilateral: Secondary | ICD-10-CM

## 2017-07-03 DIAGNOSIS — R070 Pain in throat: Secondary | ICD-10-CM

## 2017-07-03 DIAGNOSIS — K219 Gastro-esophageal reflux disease without esophagitis: Secondary | ICD-10-CM

## 2017-07-03 DIAGNOSIS — R05 Cough: Secondary | ICD-10-CM | POA: Diagnosis not present

## 2017-07-06 ENCOUNTER — Encounter (INDEPENDENT_AMBULATORY_CARE_PROVIDER_SITE_OTHER): Payer: Self-pay | Admitting: Internal Medicine

## 2017-07-18 ENCOUNTER — Ambulatory Visit (INDEPENDENT_AMBULATORY_CARE_PROVIDER_SITE_OTHER): Payer: BC Managed Care – PPO | Admitting: Internal Medicine

## 2017-07-18 ENCOUNTER — Encounter (INDEPENDENT_AMBULATORY_CARE_PROVIDER_SITE_OTHER): Payer: Self-pay | Admitting: Internal Medicine

## 2017-07-18 VITALS — BP 92/70 | HR 68 | Temp 98.8°F | Resp 18 | Ht 61.0 in | Wt 119.5 lb

## 2017-07-18 DIAGNOSIS — K219 Gastro-esophageal reflux disease without esophagitis: Secondary | ICD-10-CM

## 2017-07-18 DIAGNOSIS — K5900 Constipation, unspecified: Secondary | ICD-10-CM | POA: Diagnosis not present

## 2017-07-18 DIAGNOSIS — R1013 Epigastric pain: Secondary | ICD-10-CM

## 2017-07-18 MED ORDER — SUCRALFATE 1 G PO TABS: 1.0000 g | ORAL_TABLET | Freq: Every day | ORAL | 5 refills | Status: DC

## 2017-07-18 NOTE — Progress Notes (Signed)
Presenting complaint;  Follow-up for epigastric pain and bloating and constipation.  Subjective:  Patient is 46 year old female who was last seen on 02/07/2017 for the symptoms. She has had chronic epigastric pain. EGD 12 years ago was normal. She has had gallbladder removed previously. Similarly she been complaining of constipation. She normal colonoscopy about 12 years ago. I felt she could have constipation predominant IBS. Upper GI series with small bowel follow-through was planned but postponed because she felt better with pantoprazole. She was also begun on Linzess. Celiac antibody panel was negative. She was encouraged to use glycerin or Dulcolax suppository on as-needed basis.  She remains with multiple complaints. She stopped linzess because of abdominal pain. She continues to complain of bloating burning pain in left upper and left lower quadrant. She also complains of epigastric pain on waking up every morning. This pain last for 20th 30 minutes. She says abdominal massage helps. This pain is not associated with nausea or vomiting. She seemed to have more pain and bloating with certain foods such as common leg. Wedge table ordered certain types of solids. She is not having 2-3 bowel movements per week. She also saw Dr. Benjamine Mola for constant feeling of lump in her throat and he felt that her throat symptoms are secondary to GERD and he recommended continuing PPI. She she feels she is 50% better.    Current Medications: Outpatient Encounter Prescriptions as of 07/18/2017  Medication Sig  . cyanocobalamin (,VITAMIN B-12,) 1000 MCG/ML injection Inject 100 mcg into the muscle. Patient 's last dose was last month.  . pantoprazole (PROTONIX) 40 MG tablet Take 1 tablet (40 mg total) by mouth daily before breakfast.  . [DISCONTINUED] linaclotide (LINZESS) 72 MCG capsule Take 1 capsule (72 mcg total) by mouth daily before breakfast. (Patient not taking: Reported on 07/18/2017)  . [DISCONTINUED]  Vitamin D, Ergocalciferol, (DRISDOL) 50000 units CAPS capsule Take 50,000 Units by mouth every 7 (seven) days. Patient will start taking medication.   No facility-administered encounter medications on file as of 07/18/2017.      Objective: Blood pressure 92/70, pulse 68, temperature 98.8 F (37.1 C), temperature source Oral, resp. rate 18, height 5\' 1"  (1.549 m), weight 119 lb 8 oz (54.2 kg). Patient is alert and in no acute distress. Conjunctiva is pink. Sclera is nonicteric Oropharyngeal mucosa is normal. No neck masses or thyromegaly noted. Cardiac exam with regular rhythm normal S1 and S2. No murmur or gallop noted. Lungs are clear to auscultation. Abdomen is symmetrical. Bowel sounds are normal. On palpation she has mild tenderness in mid epigastrium below the left costal margin at LLQ. No organomegaly or masses. No LE edema or clubbing noted.  Labs/studies Results:   H. pylori serology negative in December 2017. Celiac antibody panel negative on 02/07/2017.  Assessment:  #1. Chronic epigastric pain. She has had this pain for several years and EGD 10 years ago was negative. H. pylori serology was negative in December 2017. She has not responded to acid suppression or anti-spasmodic. Pain may be due to due to duodenogastric bile reflux. If she does not respond to sucralfate will consider EGD.  #2. Chronic GERD. Heartburn is well controlled with therapy with she is still having throat symptoms.  #3. Chronic constipation. Suspect she has constipation predominant IBS. She needs to make sure she has at least 1 bowel movement every other day.   Plan:  Sucralfate 2 g by mouth daily at bedtime. Use Dulcolax suppository on as-needed basis. High-fiber diet as tolerated. Patient  will call with progress report in 2 weeks. If she remains with epigastric pain will consider diagnostic esophagogastroduodenoscopy. Office visit in 3 months.

## 2017-07-18 NOTE — Patient Instructions (Addendum)
Please call office with progress report in 2 weeks. Can use Dulcolax suppository on as-needed basis.

## 2017-08-18 ENCOUNTER — Encounter (HOSPITAL_COMMUNITY): Payer: Self-pay | Admitting: Emergency Medicine

## 2017-08-18 ENCOUNTER — Ambulatory Visit (HOSPITAL_COMMUNITY)
Admission: EM | Admit: 2017-08-18 | Discharge: 2017-08-18 | Disposition: A | Discharge status: Home / Self Care | Payer: BC Managed Care – PPO | Attending: Urgent Care | Admitting: Urgent Care

## 2017-08-18 DIAGNOSIS — R350 Frequency of micturition: Secondary | ICD-10-CM | POA: Diagnosis present

## 2017-08-18 DIAGNOSIS — N3090 Cystitis, unspecified without hematuria: Secondary | ICD-10-CM | POA: Insufficient documentation

## 2017-08-18 DIAGNOSIS — R109 Unspecified abdominal pain: Secondary | ICD-10-CM | POA: Diagnosis present

## 2017-08-18 DIAGNOSIS — K5900 Constipation, unspecified: Secondary | ICD-10-CM | POA: Insufficient documentation

## 2017-08-18 DIAGNOSIS — R102 Pelvic and perineal pain: Secondary | ICD-10-CM | POA: Insufficient documentation

## 2017-08-18 DIAGNOSIS — R300 Dysuria: Secondary | ICD-10-CM | POA: Insufficient documentation

## 2017-08-18 LAB — POCT URINALYSIS DIP (DEVICE)
BILIRUBIN URINE: NEGATIVE
Glucose, UA: NEGATIVE mg/dL
KETONES UR: NEGATIVE mg/dL
NITRITE: POSITIVE — AB
Protein, ur: 30 mg/dL — AB
Specific Gravity, Urine: 1.02 (ref 1.005–1.030)
Urobilinogen, UA: 0.2 mg/dL (ref 0.0–1.0)
pH: 7 (ref 5.0–8.0)

## 2017-08-18 LAB — POCT PREGNANCY, URINE: Preg Test, Ur: NEGATIVE

## 2017-08-18 MED ORDER — SULFAMETHOXAZOLE-TRIMETHOPRIM 800-160 MG PO TABS: 1.0000 | ORAL_TABLET | Freq: Two times a day (BID) | ORAL | 0 refills | Status: DC

## 2017-08-18 NOTE — ED Triage Notes (Signed)
Pt reports lower abdominal pain x2 days that she describes as sharp.  She has had urinary urgency and bloating.  She also reports a pink tinge to her urine and toilet paper.

## 2017-08-18 NOTE — ED Provider Notes (Signed)
MRN: [id number] DOB: [birth year]  Subjective:   Gail [name] is a 46 y.o. female presenting for chief complaint of Abdominal Pain  Reports 2 day history of lower abdominal pain, urinary frequency, urinary urgency, dysuria, nausea without vomiting, bloating. Pain is worse when she has to urinate. Denies fever, cloudy urine, malodorous urine, genital rash. Patient is sexually active with her husband only. Patient takes Linzess as needed for constipation. Has BM once every other day, managed by her GI doctor.  No current facility-administered medications for this encounter.   Current Outpatient Prescriptions:  .  cyanocobalamin (,VITAMIN B-12,) 1000 MCG/ML injection, Inject 100 mcg into the muscle. Patient 's last dose was last month., Disp: , Rfl:  .  pantoprazole (PROTONIX) 40 MG tablet, Take 1 tablet (40 mg total) by mouth daily before breakfast., Disp: 30 tablet, Rfl: 5 .  sucralfate (CARAFATE) 1 g tablet, Take 1 tablet (1 g total) by mouth at bedtime., Disp: 60 tablet, Rfl: 5   Gail [name] is allergic to mint chocolate chip flavor [flavoring agent].  Gail [name]  has a past medical history of Fibromyalgia; Lupus; and Sjogren's disease ([place]). Also  has a past surgical history that includes Appendectomy; Gallbladder surgery; Cholecystectomy; Colonoscopy; and Upper gastrointestinal endoscopy.  Objective:   Vitals: BP 117/75 (BP Location: Left Arm)   Pulse 79   Temp 97.9 F (36.6 C) (Oral)   LMP  (LMP Unknown)   SpO2 100%   Physical Exam  Constitutional: She is oriented to person, place, and time. She appears well-developed and well-nourished.  Cardiovascular: Normal rate, regular rhythm and intact distal pulses.  Exam reveals no gallop and no friction rub.   No murmur heard. Pulmonary/Chest: No respiratory distress. She has no wheezes. She has no rales.  Abdominal: Soft. Bowel sounds are normal. She exhibits no distension and no mass. There is tenderness (generalized throughout, worse over lower  abdomen). There is no guarding.  No CVA tenderness.  Neurological: She is alert and oriented to person, place, and time.  Skin: Skin is warm and dry.   Results for orders placed or performed during the hospital encounter of 08/18/17 (from the past 24 hour(s))  POCT urinalysis dip (device)     Status: Abnormal   Collection Time: 08/18/17 11:50 AM  Result Value Ref Range   Glucose, UA NEGATIVE NEGATIVE mg/dL   Bilirubin Urine NEGATIVE NEGATIVE   Ketones, ur NEGATIVE NEGATIVE mg/dL   Specific Gravity, Urine 1.020 1.005 - 1.030   Hgb urine dipstick MODERATE (A) NEGATIVE   pH 7.0 5.0 - 8.0   Protein, ur 30 (A) NEGATIVE mg/dL   Urobilinogen, UA 0.2 0.0 - 1.0 mg/dL   Nitrite POSITIVE (A) NEGATIVE   Leukocytes, UA TRACE (A) NEGATIVE  Pregnancy, urine POC     Status: None   Collection Time: 08/18/17 12:02 PM  Result Value Ref Range   Preg Test, Ur NEGATIVE NEGATIVE   Assessment and Plan :   Cystitis  Dysuria  Urinary frequency  Pelvic pain in female   Start Bactrim to cover for cystitis, urine culture pending. Recommended patient hydrate aggressively. Return-to-clinic precautions discussed, patient verbalized understanding.    Jaynee Eagles, PA-C [facility]  08/18/2017  11:47 AM    Jaynee Eagles, PA-C 08/18/17 1205

## 2017-08-20 ENCOUNTER — Telehealth (HOSPITAL_COMMUNITY): Payer: Self-pay | Admitting: Internal Medicine

## 2017-08-20 LAB — URINE CULTURE

## 2017-08-20 MED ORDER — CEPHALEXIN 500 MG PO CAPS: 500.0000 mg | ORAL_CAPSULE | Freq: Two times a day (BID) | ORAL | 0 refills | Status: AC

## 2017-08-20 NOTE — Telephone Encounter (Signed)
Clinical staff please let patient know that urine culture was positive for E coli germ, resistant to trimethoprim/sulfa rx given at the urgent care visit 9/21.  Stop trimethoprim/sulfa.  Rx cephalexin sent to the pharmacy of record, [facility] on [address] in [place].  Recheck or followup with PCP for further evaluation if symptoms are not improving.  LM

## 2017-10-10 ENCOUNTER — Encounter (INDEPENDENT_AMBULATORY_CARE_PROVIDER_SITE_OTHER): Payer: Self-pay | Admitting: Internal Medicine

## 2017-10-10 ENCOUNTER — Encounter (INDEPENDENT_AMBULATORY_CARE_PROVIDER_SITE_OTHER): Payer: Self-pay | Admitting: *Deleted

## 2017-10-10 ENCOUNTER — Ambulatory Visit (INDEPENDENT_AMBULATORY_CARE_PROVIDER_SITE_OTHER): Payer: BC Managed Care – PPO | Admitting: Internal Medicine

## 2017-10-10 VITALS — BP 98/68 | HR 64 | Temp 98.0°F | Resp 18 | Ht 61.0 in | Wt 125.6 lb

## 2017-10-10 DIAGNOSIS — K5900 Constipation, unspecified: Secondary | ICD-10-CM

## 2017-10-10 DIAGNOSIS — K219 Gastro-esophageal reflux disease without esophagitis: Secondary | ICD-10-CM | POA: Diagnosis not present

## 2017-10-10 MED ORDER — PLECANATIDE 3 MG PO TABS: 3.0000 mg | ORAL_TABLET | Freq: Every day | ORAL | 0 refills | Status: DC

## 2017-10-10 MED ORDER — PANTOPRAZOLE SODIUM 40 MG PO TBEC: 40.0000 mg | DELAYED_RELEASE_TABLET | Freq: Every day | ORAL | 5 refills | Status: DC

## 2017-10-10 NOTE — Patient Instructions (Signed)
Solid phase gastric emptying study to be scheduled.

## 2017-10-10 NOTE — Progress Notes (Signed)
Presenting complaint;  Follow-up for GERD bloating and constipation.  Subjective:  Patient is 46 year old female who is here for scheduled visit.  She was last seen on 07/18/2017.  She complains of frequent regurgitation particularly at night.  When this occurs she spits up small amount of food debris.  She denies hematemesis.  She states this occurs within 5-10 minutes of her meals.  She also complained of nausea but no vomiting.  She is not taking pantoprazole anymore.  She admits to eating too much food since her parents are visiting and she is enjoying her mother's cooking.  She continues to complain of feeling bloated and epigastric region at times feel tight in epigastrium.  She is not having frequent heartburn.  She also denies dysphagia.  She remains with constipation.  She has 2-3 bowel movements per week.  She has tried various foods such as Quinoa but she gets relief only for a few days. She had a EGD in 2005 in 2010 and her last colonoscopy was in 2006. She has gained 6 pounds since her last visit.   Current Medications: Outpatient Encounter Medications as of 10/10/2017  Medication Sig  . cyanocobalamin (,VITAMIN B-12,) 1000 MCG/ML injection Inject 100 mcg into the muscle. Patient 's last dose was last month.  . [DISCONTINUED] pantoprazole (PROTONIX) 40 MG tablet Take 1 tablet (40 mg total) by mouth daily before breakfast. (Patient not taking: Reported on 10/10/2017)  . [DISCONTINUED] sucralfate (CARAFATE) 1 g tablet Take 1 tablet (1 g total) by mouth at bedtime. (Patient not taking: Reported on 10/10/2017)  . [DISCONTINUED] sulfamethoxazole-trimethoprim (BACTRIM DS,SEPTRA DS) 800-160 MG tablet Take 1 tablet by mouth 2 (two) times daily. (Patient not taking: Reported on 10/10/2017)   No facility-administered encounter medications on file as of 10/10/2017.      Objective: Blood pressure 98/68, pulse 64, temperature 98 F (36.7 C), temperature source Oral, resp. rate 18, height 5'  1" (1.549 m), weight 125 lb 9.6 oz (57 kg). Patient is alert and in no acute distress. Conjunctiva is pink. Sclera is nonicteric Oropharyngeal mucosa is normal. No neck masses or thyromegaly noted. Cardiac exam with regular rhythm normal S1 and S2. No murmur or gallop noted. Lungs are clear to auscultation. Abdomen is symmetrical.  On palpation abdomen is soft with mild tenderness at epigastrium.  No organomegaly or masses. No LE edema or clubbing noted.  Labs/studies Results:  Celiac antibody panel negative in March 2018.  Assessment:  #1.  Chronic GERD.  Patient's symptoms have relapsed off therapy.  She is having frequent postprandial regurgitation particularly at night.  She is not able to control her symptoms with dietary measures.  She is therefore better off going back on PPI.  Given history of connective tissue disorder she may have underlying esophageal dysmotility which predisposes her to severe gastroesophageal reflux disease.  Prior EGDs have been unremarkable though last one was in 2010.  #2.  Constipation.  She is not having desired results with dietary measures.  #3.  She also has symptoms of chronic dyspepsia.  She certainly could have gastroparesis he is given poorly controlled GERD symptoms.  Celiac antibody is negative earlier this year.   Plan:  Patient advised to go back on pantoprazole 40 mg p.o. every morning.  Once symptoms are controlled will consider every other day dose. Solid-phase gastric emptying study. Plecanatide 3 mg p.o. every morning.  2-week supply given.  If it works will call prescription. Office visit in 6 months.

## 2017-10-13 ENCOUNTER — Encounter (HOSPITAL_COMMUNITY): Payer: BC Managed Care – PPO

## 2017-11-14 ENCOUNTER — Encounter (HOSPITAL_COMMUNITY)
Admission: RE | Admit: 2017-11-14 | Discharge: 2017-11-14 | Disposition: A | Discharge status: Home / Self Care | Payer: BC Managed Care – PPO | Source: Ambulatory Visit | Attending: Internal Medicine | Admitting: Internal Medicine

## 2017-11-14 DIAGNOSIS — K219 Gastro-esophageal reflux disease without esophagitis: Secondary | ICD-10-CM

## 2017-11-14 MED ORDER — TECHNETIUM TC 99M SULFUR COLLOID
2.0000 | Freq: Once | INTRAVENOUS | Status: AC | PRN
  Administered 2017-11-14: 2 via ORAL

## 2017-11-15 ENCOUNTER — Telehealth (INDEPENDENT_AMBULATORY_CARE_PROVIDER_SITE_OTHER): Payer: Self-pay | Admitting: *Deleted

## 2017-11-15 NOTE — Telephone Encounter (Signed)
Patient called and states that she had her Gastric Study yesterday,11/14/2017. During the test she experienced Chest Pain on the Left Side, Pressure. Early morning she awoke to N&V , Dizzy, Chills , and some diarrhea. She says she has not been running a fever. What she threw up was yellow in color.  She ate a light breakfast and feels just a little better. She C/O feeling weak , tired, some dizziness , sleepy and hurts in abdomen and back but relates this to having throw up so bad earlier today.  Patient advises that this would be reviewed with Dr.Rehman and I would call her back.  Per Dr.Rehman - these symptoms are not related to the patient's test yesterday. May call in Zofran 4 mg - take 1 by mouth three times daily prn for nausea and vomiting. #20 This was called to [facility] , patient was made aware and ask to call with a progress report Friday morning, if her symptoms persist she should follow up with her PCP. She was also made aware that her test ,Gastric Emptying Study was normal and Dr.Rehman will follow up with her.

## 2017-11-15 NOTE — Telephone Encounter (Signed)
When the patient was called , a message was left on her voice mail with Dr.Rehman's recommendations. I ask that she call with a progress report prior to 11:30 am on Friday,11/17/2017 and if symptoms worsened to see her PCP.

## 2018-04-17 ENCOUNTER — Ambulatory Visit (INDEPENDENT_AMBULATORY_CARE_PROVIDER_SITE_OTHER): Payer: BC Managed Care – PPO | Admitting: Internal Medicine

## 2020-02-02 ENCOUNTER — Ambulatory Visit: Payer: BC Managed Care – PPO | Attending: Internal Medicine

## 2020-02-02 DIAGNOSIS — Z23 Encounter for immunization: Secondary | ICD-10-CM | POA: Insufficient documentation

## 2020-02-02 NOTE — Progress Notes (Signed)
   Covid-19 Vaccination Clinic  Name:  Gail [name]    MRN: [id number] DOB: June 27, 1971  02/02/2020  [name] was observed post Covid-19 immunization for 15 minutes without incident. She was provided with Vaccine Information Sheet and instruction to access the V-Safe system.   [name] was instructed to call 911 with any severe reactions post vaccine: Marland Kitchen Difficulty breathing  . Swelling of face and throat  . A fast heartbeat  . A bad rash all over body  . Dizziness and weakness   Immunizations Administered    Name Date Dose VIS Date Route   Pfizer COVID-19 Vaccine 02/02/2020 11:45 AM 0.3 mL 11/08/2019 Intramuscular   Manufacturer: [place]   Lot: [id number]   [place]: [id number]

## 2020-02-23 ENCOUNTER — Ambulatory Visit: Payer: BC Managed Care – PPO | Attending: Internal Medicine

## 2020-02-23 DIAGNOSIS — Z23 Encounter for immunization: Secondary | ICD-10-CM

## 2020-02-23 NOTE — Progress Notes (Signed)
   Covid-19 Vaccination Clinic  Name:  Gail [name]    MRN: [id number] DOB: 1971-01-06  02/23/2020  Gail [name] was observed post Covid-19 immunization for 15 minutes without incident. She was provided with Vaccine Information Sheet and instruction to access the V-Safe system.   Gail [name] was instructed to call 911 with any severe reactions post vaccine: Marland Kitchen Difficulty breathing  . Swelling of face and throat  . A fast heartbeat  . A bad rash all over body  . Dizziness and weakness   Immunizations Administered    Name Date Dose VIS Date Route   Pfizer COVID-19 Vaccine 02/23/2020 11:05 AM 0.3 mL 11/08/2019 Intramuscular   Manufacturer: [place]   Lot: [id number]   [place]: [id number]

## 2020-03-19 ENCOUNTER — Encounter: Payer: Self-pay | Admitting: Obstetrics and Gynecology

## 2020-03-19 ENCOUNTER — Other Ambulatory Visit: Payer: Self-pay

## 2020-03-19 ENCOUNTER — Other Ambulatory Visit (HOSPITAL_COMMUNITY)
Admission: RE | Admit: 2020-03-19 | Discharge: 2020-03-19 | Disposition: A | Discharge status: Home / Self Care | Payer: BC Managed Care – PPO | Source: Ambulatory Visit | Attending: Obstetrics and Gynecology | Admitting: Obstetrics and Gynecology

## 2020-03-19 ENCOUNTER — Ambulatory Visit (INDEPENDENT_AMBULATORY_CARE_PROVIDER_SITE_OTHER): Payer: BC Managed Care – PPO | Admitting: Obstetrics and Gynecology

## 2020-03-19 VITALS — BP 98/65 | HR 78 | Ht 61.0 in | Wt 128.0 lb

## 2020-03-19 DIAGNOSIS — Z01419 Encounter for gynecological examination (general) (routine) without abnormal findings: Secondary | ICD-10-CM

## 2020-03-19 DIAGNOSIS — Z1151 Encounter for screening for human papillomavirus (HPV): Secondary | ICD-10-CM

## 2020-03-19 MED ORDER — LINACLOTIDE 72 MCG PO CAPS: 72.0000 ug | ORAL_CAPSULE | Freq: Every day | ORAL | 6 refills | Status: DC

## 2020-03-19 NOTE — Progress Notes (Signed)
PATIENT ID: Wetzel Bjornstad, female     DOB: Feb 22, 1971, 49 y.o.     MRN: [id number]    [facility] Visit  03/19/20           Patient name: Gail [name] MRN [id number]  Date of birth: 1971/08/27  CC & HPI:  Gail [name] is a 49 y.o. female presenting today for routine Pap smear and physical exam.  She notes that her periods are irregular and have been less intense. She works a sedimentary lifestyle. She notes that she does not drink a lot of water and that occasionally her urine is darker in color.   She was taking Linzess for her constipation. She notes that it helps very well, but that the full dose of it was too strong for her. She said Miralax didn't work for her;Linzess would make her stomach burn after taking for a prolonged period.   ROS:  Review of Systems  Constitutional: Negative for diaphoresis, fever, malaise/fatigue and weight loss.  HENT: Negative for congestion and sore throat.   Eyes: Negative for blurred vision and double vision.  Respiratory: Negative for cough and shortness of breath.   Cardiovascular: Negative for chest pain, palpitations and leg swelling.  Gastrointestinal: Positive for abdominal pain (occasional, more often in mornings) and constipation. Negative for diarrhea, nausea and vomiting.  Genitourinary: Negative for frequency and urgency.  Musculoskeletal: Negative for back pain, falls and myalgias.  Skin: Negative for rash.  Neurological: Negative for dizziness, weakness and headaches.  Psychiatric/Behavioral: Negative for depression. The patient is not nervous/anxious.    Pertinent History Reviewed:   Reviewed: Significant for  Medical         Past Medical History:  Diagnosis Date  . Fibromyalgia   . Lupus   . Sjogren's disease ([place])                               Surgical Hx:    Past Surgical History:  Procedure Laterality Date  . APPENDECTOMY    . CHOLECYSTECTOMY    . COLONOSCOPY    . GALLBLADDER SURGERY    . UPPER  GASTROINTESTINAL ENDOSCOPY     Medications: Reviewed & Updated - see associated section                       Current Outpatient Medications:  .  cyanocobalamin (,VITAMIN B-12,) 1000 MCG/ML injection, Inject 100 mcg into the muscle. Patient 's last dose was last month., Disp: , Rfl:  .  pantoprazole (PROTONIX) 40 MG tablet, Take 1 tablet (40 mg total) daily before breakfast by mouth., Disp: 30 tablet, Rfl: 5 .  Plecanatide (TRULANCE) 3 MG TABS, Take 3 mg daily by mouth., Disp: 15 tablet, Rfl: 0   Social History: Reviewed -  reports that she has never smoked. She has never used smokeless tobacco.  Objective Findings:  Vitals: There were no vitals taken for this visit.  PHYSICAL EXAMINATION General appearance - alert, well appearing, and in no distress, oriented to person, place, and time, normal appearing weight and well hydrated Mental status - alert, oriented to person, place, and time, normal mood, behavior, speech, dress, motor activity, and thought processes, affect appropriate to mood Chest - not examined Heart - normal rate, regular rhythm, normal S1, S2, no murmurs, rubs, clicks or gallops Abdomen - soft, nontender, nondistended, no masses or organomegaly Breasts - breasts appear normal, no suspicious  masses, no skin or nipple changes or axillary nodes Skin - normal coloration and turgor, no rashes, no suspicious skin lesions noted   PELVIC External genitalia - normal  Vulva - normal  Vagina - normal  Cervix - multip  Uterus - small   Adnexa - bowel with hard stool in llq where the pain is [employer] - n/a Rectal - rectal exam not indicated    Assessment & Plan:   A:   1.  Recommended yearly mammography.   P:  1.  Rx Linzess 72 mcg QD    By signing my name below, I, [employer], attest that this documentation has been prepared under the direction and in the presence of Jonnie Kind, MD. Electronically Signed: [place]. 03/19/20. 3:49  PM.  I personally performed the services described in this documentation, which was SCRIBED in my presence. The recorded information has been reviewed and considered accurate. It has been edited as necessary during review. Jonnie Kind, MD

## 2020-03-23 LAB — CYTOLOGY - PAP
Comment: NEGATIVE
Diagnosis: NEGATIVE
High risk HPV: NEGATIVE

## 2020-05-28 ENCOUNTER — Encounter (INDEPENDENT_AMBULATORY_CARE_PROVIDER_SITE_OTHER): Payer: Self-pay | Admitting: Gastroenterology

## 2020-05-28 ENCOUNTER — Ambulatory Visit (INDEPENDENT_AMBULATORY_CARE_PROVIDER_SITE_OTHER): Payer: BC Managed Care – PPO | Admitting: Gastroenterology

## 2020-05-28 ENCOUNTER — Other Ambulatory Visit: Payer: Self-pay

## 2020-05-28 VITALS — BP 110/69 | HR 86 | Temp 98.8°F | Ht 61.0 in | Wt 127.0 lb

## 2020-05-28 DIAGNOSIS — K5909 Other constipation: Secondary | ICD-10-CM

## 2020-05-28 DIAGNOSIS — K219 Gastro-esophageal reflux disease without esophagitis: Secondary | ICD-10-CM | POA: Diagnosis not present

## 2020-05-28 MED ORDER — PANTOPRAZOLE SODIUM 40 MG PO TBEC: 40.0000 mg | DELAYED_RELEASE_TABLET | Freq: Every day | ORAL | 3 refills | Status: DC

## 2020-05-28 NOTE — Progress Notes (Signed)
Patient profile: Gail [name] is a 49 y.o. female seen for evaluation of abd pain. Last seen 09/2017 for epigastric pain (chronic, EGD 13 years ago negative, h pylori serology negative, celiac negative).    History of Present Illness: Gail [name] is seen today for for follow-up. Long hx of chronic symptoms that seem fairly unchanged. She endorses some cramping and bloating sensation, may be slightly worse recently.  Continues to have chronic constipation with a bowel movement every 3 to 4 days. She endorses straining.  Occasionally has a small amount of rectal bleeding but feels this is from hemorrhoids.  Feels that Linzess 145 mcg caused abdominal pain.  This 72 mcg dose works well if she eats "a light amount of food" but does not help bowels as much if she eats a large meal..  Typically having a bowel movement more regularly during her menstrual cycles but then returns to the constipation between cycles.  She has noticed lactose makes symptoms worse.  Also may have found triggers such as raw broccoli make symptoms worse.  She continues to have some regurgitation and occasional nausea.  She is no longer on a PPI.  She denies any vomiting or dysphagia.  Does feel that foods with peelings don't digest well. Sometimes will have to regurgitate and spit foods out.  Wt Readings from Last 3 Encounters:  05/28/20 127 lb (57.6 kg)  03/19/20 128 lb (58.1 kg)  10/10/17 125 lb 9.6 oz (57 kg)     Last Colonoscopy: 2006 (done for constipation)--normal   Last Endoscopy: 2005   Past Medical History:  Past Medical History:  Diagnosis Date  . Fibromyalgia   . Lupus ([place])   . Sjogren's disease ([place])     Problem List: Patient Active Problem List   Diagnosis Date Noted  . Leukorrhea, vaginal, noninfectious 05/04/2015  . 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 Bacterial vaginosis 05/06/2013    Past Surgical History: Past Surgical History:  Procedure Laterality Date  . APPENDECTOMY     . CHOLECYSTECTOMY    . COLONOSCOPY    . GALLBLADDER SURGERY    . UPPER GASTROINTESTINAL ENDOSCOPY      Allergies: Allergies  Allergen Reactions  . Mint Chocolate Chip Flavor [Flavoring Agent]     Patient states that if she eats anything with mint her stomach burns.      Home Medications:  Current Outpatient Medications:  .  cholecalciferol (VITAMIN D3) 25 MCG (1000 UNIT) tablet, Take 1,000 Units by mouth daily., Disp: , Rfl:  .  cyanocobalamin (,VITAMIN B-12,) 1000 MCG/ML injection, Inject 100 mcg into the muscle. Patient 's last dose was last month., Disp: , Rfl:  .  fluticasone (VERAMYST) 27.5 MCG/SPRAY nasal spray, Place 2 sprays into the nose as needed for rhinitis., Disp: , Rfl:  .  linaclotide (LINZESS) 72 MCG capsule, Take 1 capsule (72 mcg total) by mouth daily before breakfast., Disp: 30 capsule, Rfl: 6 .  pantoprazole (PROTONIX) 40 MG tablet, Take 1 tablet (40 mg total) daily before breakfast by mouth. (Patient not taking: Reported on 05/28/2020), Disp: 30 tablet, Rfl: 5 .  pantoprazole (PROTONIX) 40 MG tablet, Take 1 tablet (40 mg total) by mouth daily., Disp: 90 tablet, Rfl: 3   Family History: family history includes Arthritis in her father; Cancer in her maternal uncle; Diabetes in her paternal uncle; Heart disease in her mother; Hyperlipidemia in her father; Other in her mother.    Social History:   reports that she has never  smoked. She has never used smokeless tobacco. She reports that she does not drink alcohol and does not use drugs.   Review of Systems: Constitutional: Denies weight loss/weight gain  Eyes: No changes in vision. ENT: No oral lesions, sore throat.  GI: see HPI.  Heme/Lymph: No easy bruising.  CV: No chest pain.  GU: No hematuria.  Integumentary: No rashes.  Neuro: No headaches.  Psych: No depression/anxiety.  Endocrine: No heat/cold intolerance.  Allergic/Immunologic: No urticaria.  Resp: No cough, SOB.  Musculoskeletal: No joint swelling.     Physical Examination: BP 110/69 (BP Location: Right Arm, Patient Position: Sitting, Cuff Size: Normal)   Pulse 86   Temp 98.8 F (37.1 C) (Oral)   Ht 5\' 1"  (1.549 m)   Wt 127 lb (57.6 kg)   BMI 24.00 kg/m  Gen: NAD, alert and oriented x 4 HEENT: PEERLA, EOMI, Neck: supple, no JVD Chest: CTA bilaterally, no wheezes, crackles, or other adventitious sounds CV: RRR, no m/g/c/r Abd: soft, NT, ND, +BS in all four quadrants; no HSM, guarding, ridigity, or rebound tenderness Ext: no edema, well perfused with 2+ pulses, Skin: no rash or lesions noted on observed skin Lymph: no noted LAD  Data Reviewed:  10/2017-GES 23.2% emptied at 1 hr ( normal >= 10%) 82.9 % emptied at 2 hr ( normal >= 40%) 97.1% emptied at 3 hr ( normal >= 70%)    Negative celiac testing 2018.  Assessment/Plan: Ms. Spraker is a 49 y.o. female seen for constipation, gerd, abd pain  1. Constipation - improved but not optimally controlled w/ linzess 50mcg. She will try trulance samples (she does not recall trying this in 2018). To notify me if works better than linzess. Can supplement w/ miralax if needed.  She is interested in repeat colonoscopy, reviewed given colonoscopy 2006 unremarkable for same symptoms may be unremarkable. She will call if would like to schedule. Denies prior issues w/ sedation.   2. GERD - symptomatic off therapy, we reviewed diet modifications and she will start looking for trigger foods.  We will also restart her on Protonix 40 mg once a day which she has done well in the past.  F/up w/ Dr Laural Golden 2-3 months.     Madelaine was seen today for follow-up.  Diagnoses and all orders for this visit:  Chronic constipation  Chronic GERD  Other orders -     pantoprazole (PROTONIX) 40 MG tablet; Take 1 tablet (40 mg total) by mouth daily.     I personally performed the service, non-incident to. (WP)  Laurine Blazer, [facility]

## 2020-05-28 NOTE — Patient Instructions (Signed)
Try trulance - one per day. Please let me know if works well and will send to pharmacy. Do not take linzess when trying Trulance   protonix sent to pharmacy

## 2020-08-24 ENCOUNTER — Other Ambulatory Visit: Payer: Self-pay

## 2020-08-24 ENCOUNTER — Encounter (INDEPENDENT_AMBULATORY_CARE_PROVIDER_SITE_OTHER): Payer: Self-pay | Admitting: *Deleted

## 2020-08-24 ENCOUNTER — Other Ambulatory Visit (INDEPENDENT_AMBULATORY_CARE_PROVIDER_SITE_OTHER): Payer: Self-pay | Admitting: *Deleted

## 2020-08-24 ENCOUNTER — Ambulatory Visit (INDEPENDENT_AMBULATORY_CARE_PROVIDER_SITE_OTHER): Payer: BC Managed Care – PPO | Admitting: Gastroenterology

## 2020-08-24 ENCOUNTER — Encounter (INDEPENDENT_AMBULATORY_CARE_PROVIDER_SITE_OTHER): Payer: Self-pay | Admitting: Gastroenterology

## 2020-08-24 ENCOUNTER — Encounter (HOSPITAL_COMMUNITY): Payer: Self-pay | Admitting: Gastroenterology

## 2020-08-24 DIAGNOSIS — Z1212 Encounter for screening for malignant neoplasm of rectum: Secondary | ICD-10-CM | POA: Insufficient documentation

## 2020-08-24 DIAGNOSIS — Z1211 Encounter for screening for malignant neoplasm of colon: Secondary | ICD-10-CM | POA: Insufficient documentation

## 2020-08-24 DIAGNOSIS — Z7189 Other specified counseling: Secondary | ICD-10-CM | POA: Diagnosis not present

## 2020-08-24 DIAGNOSIS — K589 Irritable bowel syndrome without diarrhea: Secondary | ICD-10-CM | POA: Insufficient documentation

## 2020-08-24 DIAGNOSIS — K581 Irritable bowel syndrome with constipation: Secondary | ICD-10-CM | POA: Diagnosis not present

## 2020-08-24 DIAGNOSIS — R1032 Left lower quadrant pain: Secondary | ICD-10-CM | POA: Diagnosis not present

## 2020-08-24 MED ORDER — LINACLOTIDE 72 MCG PO CAPS: 72.0000 ug | ORAL_CAPSULE | Freq: Every day | ORAL | 6 refills | Status: DC

## 2020-08-24 NOTE — Patient Instructions (Addendum)
Schedule colonoscopy Continue Linzess 72 mcg qday Explained presumed etiology of IBS symptoms. Patient was counseled about the benefit of implementing a low FODMAP to improve symptoms and recurrent episodes. A dietary list was provided to the patient. Also, the patient was counseled about the benefit of avoiding stressing situations and potential environmental triggers leading to symptomatology. Perform blood workup Start taking Miralax 1 cap every day for one week if no improvement in BM after restaring Linzess. If bowel movements do not improve, increase to 1 cup every 12 hours. If after two weeks there is no improvement, increase to 1 cup every 8 hours

## 2020-08-24 NOTE — Progress Notes (Signed)
[name], M.D. Gastroenterology & Hepatology [facility] [address] [place], [place] [address]  Primary Care Physician: Ranae Palms, MD [place] [address]  I will communicate my assessment and recommendations to the referring MD via EMR. "Note: Occasional unusual wording and randomly placed punctuation marks may result from the use of speech recognition technology to transcribe this document"  Problems: 1. IBS-C 2. Left lower quadrant abdominal pain  History of Present Illness: Gail [name] is a 49 y.o. female with past medical history of fibromyalgia, lupus, Sjogren's disease and IBS-C, who presents for evaluation of episodes of left lower quadrant abdominal pain and constipation.  Patient was last seen in clinic on 05/28/2020.  At that time the patient was given samples of Trulance.  Also was restarted on Protonix 40 mg given episodes of heartburn.  Patient states past Thursday she had L lower abdominal pain, described as a dull constant pain which was significant in severity.  Also reported having absence of bowel movements for a few days, for which she reports she had a liquid glycerine suppository, this was followed by a BM and the pain slightly improved. She states the next day her abdomen felt sore diffusely but eventually went away. States she is having similar symptoms every 1-2 months which she believes is mostly related to her constipation. She has also felt nauseated without vomiting recently.  Overall, she is concerned as she does not know why she is having the abdominal pain.  She reports she only tried Trulance once, but has been mostly taking Linzess 72 mcg as needed. She states for the last 3 days she started taking the Linzess on a daily basis, has had some improvement in her BM but still feels bloated and thinks "her bowels have not completely moved". Has noticed some scan blood in stool when  she strains too much to have a BM. She has been eating less due to the abdominal pain, has lost 3 lb to lose weight.  The patient denies having any vomiting, fever, chills, melena, hematemesis, diarrhea, jaundice, pruritus.  Last Colonoscopy: 2006 - normal  Past Medical History: Past Medical History:  Diagnosis Date  . Fibromyalgia   . Lupus ([place])   . Sjogren's disease [facility])     Past Surgical History: Past Surgical History:  Procedure Laterality Date  . APPENDECTOMY    . CHOLECYSTECTOMY    . COLONOSCOPY    . GALLBLADDER SURGERY    . UPPER GASTROINTESTINAL ENDOSCOPY      Family History: Family History  Problem Relation Age of Onset  . Other Mother        breast cyst  . Heart disease Mother   . Arthritis Father   . Hyperlipidemia Father   . Cancer Maternal Uncle        colon  . Diabetes Paternal Uncle     Social History: Social History   Tobacco Use  Smoking Status Never Smoker  Smokeless Tobacco Never Used   Social History   Substance and Sexual Activity  Alcohol Use No   Social History   Substance and Sexual Activity  Drug Use No    Allergies: Allergies  Allergen Reactions  . Other     Mint -Patient states that if she eats anything with mint her stomach burns.    Medications: Current Outpatient Medications  Medication Sig Dispense Refill  . cyanocobalamin (,VITAMIN B-12,) 1000 MCG/ML injection Inject 1,000 mcg into the muscle See  admin instructions. Inject 1000 mcg once every 30 -60 days    . linaclotide (LINZESS) 72 MCG capsule Take 1 capsule (72 mcg total) by mouth daily before breakfast. 30 capsule 6  . antiseptic oral rinse (BIOTENE) LIQD 15 mLs by Mouth Rinse route 2 (two) times daily.    . fluticasone (FLONASE) 50 MCG/ACT nasal spray Place 2 sprays into both nostrils daily as needed for allergies or rhinitis.    . Simethicone (GAS-X PO) Take 0.5-1 tablets by mouth daily as needed (gas).     No current facility-administered medications for  this visit.    Review of Systems: GENERAL: negative for malaise, night sweats HEENT: No changes in hearing or vision, no nose bleeds or other nasal problems. NECK: Negative for lumps, goiter, pain and significant neck swelling RESPIRATORY: Negative for cough, wheezing CARDIOVASCULAR: Negative for chest pain, leg swelling, palpitations, orthopnea GI: SEE HPI MUSCULOSKELETAL: Negative for joint pain or swelling, back pain, and muscle pain. SKIN: Negative for lesions, rash PSYCH: Negative for sleep disturbance, mood disorder and recent psychosocial stressors. HEMATOLOGY Negative for prolonged bleeding, bruising easily, and swollen nodes. ENDOCRINE: Negative for cold or heat intolerance, polyuria, polydipsia and goiter. NEURO: negative for tremor, gait imbalance, syncope and seizures. The remainder of the review of systems is noncontributory.   Physical Exam: BP 107/75 (BP Location: Left Arm, Patient Position: Sitting, Cuff Size: Small)   Pulse 87   Temp 99.4 F (37.4 C) (Oral)   Ht 5\' 1"  (1.549 m)   Wt 123 lb 1.6 oz (55.8 kg)   BMI 23.26 kg/m  GENERAL: The patient is AO x3, in no acute distress. HEENT: Head is normocephalic and atraumatic. EOMI are intact. Mouth is well hydrated and without lesions. NECK: Supple. No masses LUNGS: Clear to auscultation. No presence of rhonchi/wheezing/rales. Adequate chest expansion HEART: RRR, normal s1 and s2. ABDOMEN: Mildly tender upon palpation of the left side of the abdomen, no guarding, no peritoneal signs, and nondistended. BS +. No masses. EXTREMITIES: Without any cyanosis, clubbing, rash, lesions or edema. NEUROLOGIC: AOx3, no focal motor deficit. SKIN: no jaundice, no rashes  Imaging/Labs: as above  I personally reviewed and interpreted the available labs, imaging and endoscopic files.  Impression and Plan: Gail [name] is a 49 y.o. female with past medical history of fibromyalgia, lupus, Sjogren's disease and IBS-C, who  presents for evaluation of episodes of left lower quadrant abdominal pain and constipation.  The patient has presented chronic symptoms of constipation and intermittent abdominal pain.  She has lost some weight recently due to a change in her appetite but has not presented any other major red flag signs.  I consider that given the chronicity of her symptoms, her presentation is most likely consistent with bowel hypersensitivity in the setting of IBS-C, I explained to her the importance of taking medication on a constant basis to improve her symptomatology.  Due to this, she was counseled to take the Linzess 72 mcg on a daily basis instead of as needed.  In case this does not improve her bowel movement frequency, she may benefit of starting MiraLAX concomitantly.  I also advised her about the benefit of following a low FODMAP diet.  Patient understood and agreed.  We will check for celiac disease at this time.  Finally, the patient is due for colorectal cancer screening given her age, we will proceed with a colonoscopy.  - Schedule colonoscopy - Continue Linzess 72 mcg qday - Explained presumed etiology of IBS symptoms. Patient was  counseled about the benefit of implementing a low FODMAP to improve symptoms and recurrent episodes. A dietary list was provided to the patient. Also, the patient was counseled about the benefit of avoiding stressing situations and potential environmental triggers leading to symptomatology. - Check TTG IgA and IgA - Start taking Miralax 1 cap every day for one week if no improvement in BM after restaring Linzess. - RTC 3 months  All questions were answered.      Harvel Quale, MD Gastroenterology and Hepatology [facility]

## 2020-08-24 NOTE — H&P (View-Only) (Signed)
Gail [name], M.D. Gastroenterology & Hepatology [facility] [address] [place], [place] [address]  Primary Care Physician: Ranae Palms, MD [place] [address]  I will communicate my assessment and recommendations to the referring MD via EMR. "Note: Occasional unusual wording and randomly placed punctuation marks may result from the use of speech recognition technology to transcribe this document"  Problems: 1. IBS-C 2. Left lower quadrant abdominal pain  History of Present Illness: Gail [name] is a 49 y.o. female with past medical history of fibromyalgia, lupus, Sjogren's disease and IBS-C, who presents for evaluation of episodes of left lower quadrant abdominal pain and constipation.  Patient was last seen in clinic on 05/28/2020.  At that time the patient was given samples of Trulance.  Also was restarted on Protonix 40 mg given episodes of heartburn.  Patient states past Thursday she had L lower abdominal pain, described as a dull constant pain which was significant in severity.  Also reported having absence of bowel movements for a few days, for which she reports she had a liquid glycerine suppository, this was followed by a BM and the pain slightly improved. She states the next day her abdomen felt sore diffusely but eventually went away. States she is having similar symptoms every 1-2 months which she believes is mostly related to her constipation. She has also felt nauseated without vomiting recently.  Overall, she is concerned as she does not know why she is having the abdominal pain.  She reports she only tried Trulance once, but has been mostly taking Linzess 72 mcg as needed. She states for the last 3 days she started taking the Linzess on a daily basis, has had some improvement in her BM but still feels bloated and thinks "her bowels have not completely moved". Has noticed some scan blood in stool when  she strains too much to have a BM. She has been eating less due to the abdominal pain, has lost 3 lb to lose weight.  The patient denies having any vomiting, fever, chills, melena, hematemesis, diarrhea, jaundice, pruritus.  Last Colonoscopy: 2006 - normal  Past Medical History: Past Medical History:  Diagnosis Date  . Fibromyalgia   . Lupus ([place])   . Sjogren's disease [facility])     Past Surgical History: Past Surgical History:  Procedure Laterality Date  . APPENDECTOMY    . CHOLECYSTECTOMY    . COLONOSCOPY    . GALLBLADDER SURGERY    . UPPER GASTROINTESTINAL ENDOSCOPY      Family History: Family History  Problem Relation Age of Onset  . Other Mother        breast cyst  . Heart disease Mother   . Arthritis Father   . Hyperlipidemia Father   . Cancer Maternal Uncle        colon  . Diabetes Paternal Uncle     Social History: Social History   Tobacco Use  Smoking Status Never Smoker  Smokeless Tobacco Never Used   Social History   Substance and Sexual Activity  Alcohol Use No   Social History   Substance and Sexual Activity  Drug Use No    Allergies: Allergies  Allergen Reactions  . Other     Mint -Patient states that if she eats anything with mint her stomach burns.    Medications: Current Outpatient Medications  Medication Sig Dispense Refill  . cyanocobalamin (,VITAMIN B-12,) 1000 MCG/ML injection Inject 1,000 mcg into the muscle See  admin instructions. Inject 1000 mcg once every 30 -60 days    . linaclotide (LINZESS) 72 MCG capsule Take 1 capsule (72 mcg total) by mouth daily before breakfast. 30 capsule 6  . antiseptic oral rinse (BIOTENE) LIQD 15 mLs by Mouth Rinse route 2 (two) times daily.    . fluticasone (FLONASE) 50 MCG/ACT nasal spray Place 2 sprays into both nostrils daily as needed for allergies or rhinitis.    . Simethicone (GAS-X PO) Take 0.5-1 tablets by mouth daily as needed (gas).     No current facility-administered medications for  this visit.    Review of Systems: GENERAL: negative for malaise, night sweats HEENT: No changes in hearing or vision, no nose bleeds or other nasal problems. NECK: Negative for lumps, goiter, pain and significant neck swelling RESPIRATORY: Negative for cough, wheezing CARDIOVASCULAR: Negative for chest pain, leg swelling, palpitations, orthopnea GI: SEE HPI MUSCULOSKELETAL: Negative for joint pain or swelling, back pain, and muscle pain. SKIN: Negative for lesions, rash PSYCH: Negative for sleep disturbance, mood disorder and recent psychosocial stressors. HEMATOLOGY Negative for prolonged bleeding, bruising easily, and swollen nodes. ENDOCRINE: Negative for cold or heat intolerance, polyuria, polydipsia and goiter. NEURO: negative for tremor, gait imbalance, syncope and seizures. The remainder of the review of systems is noncontributory.   Physical Exam: BP 107/75 (BP Location: Left Arm, Patient Position: Sitting, Cuff Size: Small)   Pulse 87   Temp 99.4 F (37.4 C) (Oral)   Ht 5\' 1"  (1.549 m)   Wt 123 lb 1.6 oz (55.8 kg)   BMI 23.26 kg/m  GENERAL: The patient is AO x3, in no acute distress. HEENT: Head is normocephalic and atraumatic. EOMI are intact. Mouth is well hydrated and without lesions. NECK: Supple. No masses LUNGS: Clear to auscultation. No presence of rhonchi/wheezing/rales. Adequate chest expansion HEART: RRR, normal s1 and s2. ABDOMEN: Mildly tender upon palpation of the left side of the abdomen, no guarding, no peritoneal signs, and nondistended. BS +. No masses. EXTREMITIES: Without any cyanosis, clubbing, rash, lesions or edema. NEUROLOGIC: AOx3, no focal motor deficit. SKIN: no jaundice, no rashes  Imaging/Labs: as above  I personally reviewed and interpreted the available labs, imaging and endoscopic files.  Impression and Plan: Gail [name] is a 49 y.o. female with past medical history of fibromyalgia, lupus, Sjogren's disease and IBS-C, who  presents for evaluation of episodes of left lower quadrant abdominal pain and constipation.  The patient has presented chronic symptoms of constipation and intermittent abdominal pain.  She has lost some weight recently due to a change in her appetite but has not presented any other major red flag signs.  I consider that given the chronicity of her symptoms, her presentation is most likely consistent with bowel hypersensitivity in the setting of IBS-C, I explained to her the importance of taking medication on a constant basis to improve her symptomatology.  Due to this, she was counseled to take the Linzess 72 mcg on a daily basis instead of as needed.  In case this does not improve her bowel movement frequency, she may benefit of starting MiraLAX concomitantly.  I also advised her about the benefit of following a low FODMAP diet.  Patient understood and agreed.  We will check for celiac disease at this time.  Finally, the patient is due for colorectal cancer screening given her age, we will proceed with a colonoscopy.  - Schedule colonoscopy - Continue Linzess 72 mcg qday - Explained presumed etiology of IBS symptoms. Patient was  counseled about the benefit of implementing a low FODMAP to improve symptoms and recurrent episodes. A dietary list was provided to the patient. Also, the patient was counseled about the benefit of avoiding stressing situations and potential environmental triggers leading to symptomatology. - Check TTG IgA and IgA - Start taking Miralax 1 cap every day for one week if no improvement in BM after restaring Linzess. - RTC 3 months  All questions were answered.      Harvel Quale, MD Gastroenterology and Hepatology [facility]

## 2020-08-25 ENCOUNTER — Other Ambulatory Visit (HOSPITAL_COMMUNITY)
Admission: RE | Admit: 2020-08-25 | Discharge: 2020-08-25 | Disposition: A | Discharge status: Home / Self Care | Payer: BC Managed Care – PPO | Source: Ambulatory Visit | Attending: Gastroenterology | Admitting: Gastroenterology

## 2020-08-25 ENCOUNTER — Other Ambulatory Visit: Payer: Self-pay

## 2020-08-25 DIAGNOSIS — K581 Irritable bowel syndrome with constipation: Secondary | ICD-10-CM | POA: Insufficient documentation

## 2020-08-25 DIAGNOSIS — Z01812 Encounter for preprocedural laboratory examination: Secondary | ICD-10-CM | POA: Diagnosis present

## 2020-08-25 DIAGNOSIS — Z20822 Contact with and (suspected) exposure to covid-19: Secondary | ICD-10-CM | POA: Insufficient documentation

## 2020-08-25 LAB — SARS CORONAVIRUS 2 (TAT 6-24 HRS): SARS Coronavirus 2: NEGATIVE

## 2020-08-25 LAB — PREGNANCY, URINE: Preg Test, Ur: NEGATIVE

## 2020-08-26 ENCOUNTER — Other Ambulatory Visit: Payer: Self-pay

## 2020-08-26 ENCOUNTER — Ambulatory Visit (HOSPITAL_COMMUNITY): Payer: BC Managed Care – PPO

## 2020-08-26 ENCOUNTER — Encounter (HOSPITAL_COMMUNITY): Payer: Self-pay | Admitting: Gastroenterology

## 2020-08-26 ENCOUNTER — Ambulatory Visit (HOSPITAL_COMMUNITY): Payer: BC Managed Care – PPO | Admitting: Anesthesiology

## 2020-08-26 ENCOUNTER — Encounter (HOSPITAL_COMMUNITY)
Admission: RE | Disposition: A | Discharge status: Home / Self Care | Payer: Self-pay | Source: Home / Self Care | Attending: Gastroenterology

## 2020-08-26 ENCOUNTER — Ambulatory Visit (HOSPITAL_COMMUNITY)
Admission: RE | Admit: 2020-08-26 | Discharge: 2020-08-26 | Disposition: A | Discharge status: Home / Self Care | Payer: BC Managed Care – PPO | Attending: Gastroenterology | Admitting: Gastroenterology

## 2020-08-26 DIAGNOSIS — D125 Benign neoplasm of sigmoid colon: Secondary | ICD-10-CM | POA: Insufficient documentation

## 2020-08-26 DIAGNOSIS — R1032 Left lower quadrant pain: Secondary | ICD-10-CM | POA: Insufficient documentation

## 2020-08-26 DIAGNOSIS — D123 Benign neoplasm of transverse colon: Secondary | ICD-10-CM | POA: Insufficient documentation

## 2020-08-26 DIAGNOSIS — M3500 Sicca syndrome, unspecified: Secondary | ICD-10-CM | POA: Diagnosis not present

## 2020-08-26 DIAGNOSIS — K581 Irritable bowel syndrome with constipation: Secondary | ICD-10-CM | POA: Insufficient documentation

## 2020-08-26 DIAGNOSIS — M797 Fibromyalgia: Secondary | ICD-10-CM | POA: Insufficient documentation

## 2020-08-26 DIAGNOSIS — D120 Benign neoplasm of cecum: Secondary | ICD-10-CM | POA: Diagnosis not present

## 2020-08-26 DIAGNOSIS — R109 Unspecified abdominal pain: Secondary | ICD-10-CM

## 2020-08-26 DIAGNOSIS — K648 Other hemorrhoids: Secondary | ICD-10-CM | POA: Insufficient documentation

## 2020-08-26 DIAGNOSIS — K5900 Constipation, unspecified: Secondary | ICD-10-CM | POA: Diagnosis present

## 2020-08-26 DIAGNOSIS — Z9049 Acquired absence of other specified parts of digestive tract: Secondary | ICD-10-CM | POA: Diagnosis not present

## 2020-08-26 DIAGNOSIS — M329 Systemic lupus erythematosus, unspecified: Secondary | ICD-10-CM | POA: Insufficient documentation

## 2020-08-26 DIAGNOSIS — Z1211 Encounter for screening for malignant neoplasm of colon: Secondary | ICD-10-CM | POA: Diagnosis not present

## 2020-08-26 HISTORY — PX: POLYPECTOMY: SHX5525

## 2020-08-26 HISTORY — PX: COLONOSCOPY WITH PROPOFOL: SHX5780

## 2020-08-26 LAB — TISSUE TRANSGLUTAMINASE, IGA: Tissue Transglutaminase Ab, IgA: <2 U/mL (ref 0–3)

## 2020-08-26 LAB — IGA: IgA: 219 mg/dL (ref 87–352)

## 2020-08-26 LAB — HM COLONOSCOPY

## 2020-08-26 SURGERY — COLONOSCOPY WITH PROPOFOL
Anesthesia: General

## 2020-08-26 MED ORDER — PROPOFOL 500 MG/50ML IV EMUL
INTRAVENOUS | Status: DC | PRN
  Administered 2020-08-26: 100 ug/kg/min via INTRAVENOUS

## 2020-08-26 MED ORDER — STERILE WATER FOR IRRIGATION IR SOLN
Status: DC | PRN
  Administered 2020-08-26: 100 mL

## 2020-08-26 MED ORDER — LACTATED RINGERS IV SOLN: INTRAVENOUS | Status: DC | PRN

## 2020-08-26 MED ORDER — LACTATED RINGERS IV SOLN
Freq: Once | INTRAVENOUS | Status: AC
  Administered 2020-08-26: 1000 mL via INTRAVENOUS

## 2020-08-26 MED ORDER — HYOSCYAMINE SULFATE 0.125 MG SL SUBL: 0.1250 mg | SUBLINGUAL_TABLET | Freq: Three times a day (TID) | SUBLINGUAL | 1 refills | Status: DC | PRN

## 2020-08-26 MED ORDER — PROPOFOL 10 MG/ML IV BOLUS
INTRAVENOUS | Status: DC | PRN
  Administered 2020-08-26: 70 mg via INTRAVENOUS
  Administered 2020-08-26: 30 mg via INTRAVENOUS

## 2020-08-26 MED ORDER — LIDOCAINE HCL (CARDIAC) PF 100 MG/5ML IV SOSY
PREFILLED_SYRINGE | INTRAVENOUS | Status: DC | PRN
  Administered 2020-08-26: 50 mg via INTRAVENOUS

## 2020-08-26 MED ORDER — PHENYLEPHRINE 40 MCG/ML (10ML) SYRINGE FOR IV PUSH (FOR BLOOD PRESSURE SUPPORT)
PREFILLED_SYRINGE | INTRAVENOUS | Status: DC | PRN
  Administered 2020-08-26 (×4): 80 ug via INTRAVENOUS

## 2020-08-26 NOTE — Plan of Care (Signed)
Patient underwent a KUB, there was no evidence of free air in abdomen, there was presence of mild amount of stool in her colon.  I reevaluated the patient who reported that her abdominal pain had improved, she reported that it was 2-3 out of 10.  I will send a prescription of Levsin to take as needed.  Pain likely coming from gas distention during colonoscopy.  [name], MD Gastroenterology and Hepatology [facility]

## 2020-08-26 NOTE — Anesthesia Preprocedure Evaluation (Addendum)
Anesthesia Evaluation  Patient identified by MRN, date of birth, ID band Patient awake    Reviewed: Allergy & Precautions, NPO status , Patient's Chart, lab work & pertinent test results  History of Anesthesia Complications Negative for: history of anesthetic complications  Airway Mallampati: I  TM Distance: >3 FB Neck ROM: Full    Dental  (+) Dental Advisory Given, Teeth Intact   Pulmonary neg pulmonary ROS,    Pulmonary exam normal breath sounds clear to auscultation       Cardiovascular negative cardio ROS Normal cardiovascular exam Rhythm:Regular Rate:Normal     Neuro/Psych  Neuromuscular disease negative psych ROS   GI/Hepatic negative GI ROS, Neg liver ROS,   Endo/Other  negative endocrine ROS  Renal/GU negative Renal ROS  negative genitourinary   Musculoskeletal  (+) Fibromyalgia -Lupus, Sjogren's disease     Abdominal   Peds negative pediatric ROS (+)  Hematology negative hematology ROS (+)   Anesthesia Other Findings Lupus, Sjogren's disease    Reproductive/Obstetrics                            Anesthesia Physical Anesthesia Plan  ASA: II  Anesthesia Plan: General   Post-op Pain Management:    Induction: Intravenous  PONV Risk Score and Plan: TIVA  Airway Management Planned: Nasal Cannula and Natural Airway  Additional Equipment:   Intra-op Plan:   Post-operative Plan:   Informed Consent: I have reviewed the patients History and Physical, chart, labs and discussed the procedure including the risks, benefits and alternatives for the proposed anesthesia with the patient or authorized representative who has indicated his/her understanding and acceptance.     Dental advisory given  Plan Discussed with: CRNA and Surgeon  Anesthesia Plan Comments:         Anesthesia Quick Evaluation

## 2020-08-26 NOTE — Anesthesia Procedure Notes (Signed)
Date/Time: 08/26/2020 8:24 AM Performed by: Orlie Dakin, CRNA Pre-anesthesia Checklist: Patient identified, Emergency Drugs available, Suction available and Patient being monitored Patient Re-evaluated:Patient Re-evaluated prior to induction Oxygen Delivery Method: Nasal cannula Induction Type: IV induction Placement Confirmation: positive ETCO2

## 2020-08-26 NOTE — Transfer of Care (Signed)
Immediate Anesthesia Transfer of Care Note  Patient: Gail [name]  Procedure(s) Performed: COLONOSCOPY WITH PROPOFOL (N/A ) POLYPECTOMY  Patient Location: Endoscopy Unit  Anesthesia Type:General  Level of Consciousness: drowsy  Airway & Oxygen Therapy: Patient Spontanous Breathing  Post-op Assessment: Report given to RN and Post -op Vital signs reviewed and stable  Post vital signs: Reviewed and stable  Last Vitals:  Vitals Value Taken Time  BP    Temp    Pulse    Resp    SpO2      Last Pain:  Vitals:   08/26/20 0821  TempSrc:   PainSc: 0-No pain      Patients Stated Pain Goal: 10 (83/25/49 8264)  Complications: No complications documented.

## 2020-08-26 NOTE — Plan of Care (Signed)
Patient reported having severe abdominal pain in the RLQ after procedure was performed. Denied having any other symptoms. VS were WNL. Upon physical examination, patient was found to have tenderness upon palpation of the R paraumbilical area, but abdomen was not distended, no guarding or rigidity was found.   PLAN:  - KUB STAT  [name], MD Gastroenterology and Hepatology [facility]

## 2020-08-26 NOTE — Interval H&P Note (Signed)
History and Physical Interval Note:  08/26/2020 7:40 AM Patient reports she is still having some left lower quadrant abdominal pain, 4 out of 10 intensity.  Endorses still having some constipation but recently restarted taking Linzess 72 mcg daily.  She states having some nausea but denies any vomiting, fever, chills, melena, hematemesis, diarrhea, jaundice, pruritus.  Has lost close to 3 lb.  Last Colonoscopy: 2006 - normal  BP 105/76   Pulse 75   Temp 98.7 F (37.1 C) (Oral)   Resp 14   Ht 5\' 1"  (1.549 m)   Wt 55.8 kg   LMP 07/29/2020 (Approximate)   SpO2 99%   BMI 23.24 kg/m  GENERAL: The patient is AO x3, in no acute distress. HEENT: Head is normocephalic and atraumatic. EOMI are intact. Mouth is well hydrated and without lesions. NECK: Supple. No masses LUNGS: Clear to auscultation. No presence of rhonchi/wheezing/rales. Adequate chest expansion HEART: RRR, normal s1 and s2. ABDOMEN: Soft, nontender, no guarding, no peritoneal signs, and nondistended. BS +. No masses. EXTREMITIES: Without any cyanosis, clubbing, rash, lesions or edema. NEUROLOGIC: AOx3, no focal motor deficit. SKIN: no jaundice, no rashes   EULETA BELSON  has presented today for surgery, with the diagnosis of screening.  The various methods of treatment have been discussed with the patient and family. After consideration of risks, benefits and other options for treatment, the patient has consented to  Procedure(s) with comments: COLONOSCOPY WITH PROPOFOL (N/A) - 815 as a surgical intervention.  The patient's history has been reviewed, patient examined, no change in status, stable for surgery.  I have reviewed the patient's chart and labs.  Questions were answered to the patient's satisfaction.     [name]

## 2020-08-26 NOTE — Anesthesia Postprocedure Evaluation (Signed)
Anesthesia Post Note  Patient: Gail [name]  Procedure(s) Performed: COLONOSCOPY WITH PROPOFOL (N/A ) POLYPECTOMY  Patient location during evaluation: Endoscopy Anesthesia Type: General Level of consciousness: oriented and awake and alert Pain management: pain level controlled Vital Signs Assessment: post-procedure vital signs reviewed and stable Respiratory status: spontaneous breathing, nonlabored ventilation and respiratory function stable Cardiovascular status: blood pressure returned to baseline Postop Assessment: no apparent nausea or vomiting Anesthetic complications: no   No complications documented.   Last Vitals:  Vitals:   08/26/20 0851 08/26/20 0917  BP: (!) 92/53 112/66  Pulse: 66   Resp: 19   Temp: 36.4 C   SpO2: 100%     Last Pain:  Vitals:   08/26/20 0956  TempSrc:   PainSc: [place]

## 2020-08-26 NOTE — Discharge Instructions (Signed)
You are being discharged to home.  Resume your previous diet.  We are waiting for your pathology results.  Your physician has recommended a repeat colonoscopy for surveillance based on pathology results.  Continue Linzess 72 mcg qday.   Colonoscopy, Adult, Care After This sheet gives you information about how to care for yourself after your procedure. Your health care provider may also give you more specific instructions. If you have problems or questions, contact your health care provider. What can I expect after the procedure? After the procedure, it is common to have:  A small amount of blood in your stool for 24 hours after the procedure.  Some gas.  Mild cramping or bloating of your abdomen. Follow these instructions at home: Eating and drinking   Drink enough fluid to keep your urine pale yellow.  Follow instructions from your health care provider .  Resume your normal diet. Activity  Rest as told by your health care provider.  Avoid sitting for a long time without moving. Get up to take short walks every 1-2 hours. This is important to improve blood flow and breathing. Ask for help if you feel weak or unsteady.  Managing cramping and bloating   Try walking around when you have cramps or feel bloated.  General instructions  For the first 24 hours after the procedure: ? Do not drive or use machinery. ? Do not sign important documents. ? Do not drink alcohol. ? Do your regular daily activities at a slower pace than normal. ? Eat soft foods that are easy to digest.  Take over-the-counter and prescription medicines only as told by your health care provider.  Keep all follow-up visits as told by your health care provider. This is important. Contact a health care provider if:  You have blood in your stool 2-3 days after the procedure. Get help right away if you have:  More than a small spotting of blood in your stool.  Large blood clots in your  stool.  Swelling of your abdomen.  Nausea or vomiting.  A fever.  Increasing pain in your abdomen that is not relieved with medicine. Summary  After the procedure, it is common to have a small amount of blood in your stool. You may also have mild cramping and bloating of your abdomen.  For the first 24 hours after the procedure, do not drive or use machinery, sign important documents, or drink alcohol.  Get help right away if you have a lot of blood in your stool, nausea or vomiting, a fever, or increased pain in your abdomen. This information is not intended to replace advice given to you by your health care provider. Make sure you discuss any questions you have with your health care provider. Document Revised: 06/10/2019 Document Reviewed: 06/10/2019 Elsevier Patient Education  [place].

## 2020-08-26 NOTE — Op Note (Signed)
[facility] Patient Name: Gail [name] Procedure Date: 08/26/2020 8:14 AM MRN: [id number] Date of Birth: 1970/12/06 Attending MD: [name] ,  CSN: [id number] Age: 49 Admit Type: Outpatient Procedure:                Colonoscopy Indications:              Screening for colorectal malignant neoplasm Providers:                [name], Lambert Mody, Crystal Page,                            Rosina Lowenstein, RN, Casimer Bilis, Technician Referring MD:              Medicines:                Monitored Anesthesia Care Complications:            No immediate complications. Estimated Blood Loss:     Estimated blood loss: none. Procedure:                Pre-Anesthesia Assessment:                           - Prior to the procedure, a History and Physical                            was performed, and patient medications, allergies                            and sensitivities were reviewed. The patient's                            tolerance of previous anesthesia was reviewed.                           - The risks and benefits of the procedure and the                            sedation options and risks were discussed with the                            patient. All questions were answered and informed                            consent was obtained.                           - ASA Grade Assessment: II - A patient with mild                            systemic disease.                           After obtaining informed consent, the colonoscope                            was passed under direct vision. Throughout  the                            procedure, the patient's blood pressure, pulse, and                            oxygen saturations were monitored continuously. The                            [id number]-[id number] ([id number]) scope was introduced through                            the anus and advanced to the the cecum, identified                            by appendiceal  orifice and ileocecal valve. The                            colonoscopy was performed without difficulty. The                            patient tolerated the procedure well. The quality                            of the bowel preparation was excellent. Scope                            withdrawal time was 10 minutes. Scope In: 8:26:53 AM Scope Out: 8:46:24 AM Scope Withdrawal Time: 0 hours 11 minutes 24 seconds  Total Procedure Duration: 0 hours 19 minutes 31 seconds  Findings:      The perianal and digital rectal examinations were normal.      A 2 mm polyp was found in the cecum. The polyp was sessile. The polyp       was removed with a cold biopsy forceps. Resection and retrieval were       complete.      A 3 mm polyp was found in the transverse colon. The polyp was sessile.       The polyp was removed with a cold snare. Resection and retrieval were       complete.      A 2 mm polyp was found in the sigmoid colon. The polyp was sessile. The       polyp was removed with a cold biopsy forceps. Resection and retrieval       were complete.      Non-bleeding internal hemorrhoids were found during retroflexion. The       hemorrhoids were small. Impression:               - One 2 mm polyp in the cecum, removed with a cold                            biopsy forceps. Resected and retrieved.                           - One 3 mm polyp in the transverse colon, removed  with a cold snare. Resected and retrieved.                           - One 2 mm polyp in the sigmoid colon, removed with                            a cold biopsy forceps. Resected and retrieved.                           - Non-bleeding internal hemorrhoids. Moderate Sedation:      Per Anesthesia Care Recommendation:           - Discharge patient to home (ambulatory).                           - Resume previous diet.                           - Await pathology results.                           - Repeat  colonoscopy for surveillance based on                            pathology results.                           - Continue Linzess 72 mcg qday. Procedure Code(s):        --- Professional ---                           [phone], GC, Colonoscopy, flexible; with removal of                            tumor(s), polyp(s), or other lesion(s) by snare                            technique                           45380, 95, Colonoscopy, flexible; with biopsy,                            single or multiple Diagnosis Code(s):        --- Professional ---                           Z12.11, Encounter for screening for malignant                            neoplasm of colon                           K63.5, Polyp of colon                           K64.8, Other hemorrhoids CPT copyright 2019 American Medical Association. All rights  reserved. The codes documented in this report are preliminary and upon coder review may  be revised to meet current compliance requirements. [name], MD [name],  08/26/2020 8:53:22 AM This report has been signed electronically. Number of Addenda: 0

## 2020-08-27 LAB — SURGICAL PATHOLOGY

## 2020-08-31 ENCOUNTER — Encounter (HOSPITAL_COMMUNITY): Payer: Self-pay | Admitting: Gastroenterology

## 2020-09-29 ENCOUNTER — Ambulatory Visit (INDEPENDENT_AMBULATORY_CARE_PROVIDER_SITE_OTHER): Payer: BC Managed Care – PPO | Admitting: Internal Medicine

## 2020-10-26 ENCOUNTER — Encounter (INDEPENDENT_AMBULATORY_CARE_PROVIDER_SITE_OTHER): Payer: Self-pay | Admitting: *Deleted

## 2020-12-08 ENCOUNTER — Ambulatory Visit (INDEPENDENT_AMBULATORY_CARE_PROVIDER_SITE_OTHER): Payer: BC Managed Care – PPO | Admitting: Internal Medicine

## 2021-11-28 DIAGNOSIS — R079 Chest pain, unspecified: Secondary | ICD-10-CM

## 2021-11-28 HISTORY — DX: Chest pain, unspecified: R07.9

## 2021-12-10 ENCOUNTER — Telehealth: Payer: Self-pay

## 2021-12-10 NOTE — Telephone Encounter (Signed)
NOTES SCANNED TO REFERRAL 

## 2021-12-20 NOTE — Progress Notes (Signed)
Referring-Chan Park MD Reason for referral-chest pain  HPI: 51 year old female for evaluation of chest pain at request of Hardie Shackleton MD. patient states she has had intermittent chest pain for about 1 year.  It is in the left upper chest and described as a sharp pain lasting hours at a time.  No associated symptoms.  Not pleuritic but can increase with certain movements.  Note if she is not having chest pain she has no symptoms with exertion.  She denies dyspnea on exertion, orthopnea, PND, pedal edema or syncope.  Also note she has been diagnosed with fibromyalgia.  Cardiology now asked to evaluate.  Current Outpatient Medications  Medication Sig Dispense Refill   Biotin 10 MG CAPS Take by mouth daily.     cyanocobalamin (,VITAMIN B-12,) 1000 MCG/ML injection Inject 1,000 mcg into the muscle See admin instructions. Inject 1000 mcg once every 30 -60 days     antiseptic oral rinse (BIOTENE) LIQD 15 mLs by Mouth Rinse route 2 (two) times daily. (Patient not taking: Reported on 12/31/2021)     fluticasone (FLONASE) 50 MCG/ACT nasal spray Place 2 sprays into both nostrils daily as needed for allergies or rhinitis. (Patient not taking: Reported on 12/31/2021)     hyoscyamine (LEVSIN SL) 0.125 MG SL tablet Place 1 tablet (0.125 mg total) under the tongue every 8 (eight) hours as needed for cramping. (Patient not taking: Reported on 12/31/2021) 15 tablet 1   linaclotide (LINZESS) 72 MCG capsule Take 1 capsule (72 mcg total) by mouth daily before breakfast. (Patient not taking: Reported on 12/31/2021) 30 capsule 6   Simethicone (GAS-X PO) Take 0.5-1 tablets by mouth daily as needed (gas). (Patient not taking: Reported on 12/31/2021)     No current facility-administered medications for this visit.    Allergies  Allergen Reactions   Other     Mint -Patient states that if she eats anything with mint her stomach burns.     Past Medical History:  Diagnosis Date   Fibromyalgia    Lupus ([place])    Sjogren's  disease ([place])     Past Surgical History:  Procedure Laterality Date   APPENDECTOMY     CHOLECYSTECTOMY     COLONOSCOPY     COLONOSCOPY WITH PROPOFOL N/A 08/26/2020   Procedure: COLONOSCOPY WITH PROPOFOL;  Surgeon: Harvel Quale, MD;  [facility];  Service: Gastroenterology;  Laterality: N/A;  815   POLYPECTOMY  08/26/2020   Procedure: POLYPECTOMY;  Surgeon: Montez Morita, Quillian Quince, MD;  Location: [facility];  Service: Gastroenterology;;   UPPER GASTROINTESTINAL ENDOSCOPY      Social History   Socioeconomic History   Marital status: Married    Spouse name: Not on file   Number of children: 2   Years of education: Not on file   Highest education level: Not on file  Occupational History   Not on file  Tobacco Use   Smoking status: Never   Smokeless tobacco: Never  Substance and Sexual Activity   Alcohol use: No   Drug use: No   Sexual activity: Yes    Birth control/protection: Other-see comments    Comment: husband has had vasectomy  Other Topics Concern   Not on file  Social History Narrative   Not on file   Social Determinants of Health   Financial Resource Strain: Not on file  Food Insecurity: Not on file  Transportation Needs: Not on file  Physical Activity: Not on file  Stress: Not on file  Social Connections: Not on file  Intimate Partner Violence: Not on file    Family History  Problem Relation Age of Onset   Other Mother        breast cyst   Heart disease Mother    Arthritis Father    Hyperlipidemia Father    Cancer Maternal Uncle        colon   Diabetes Paternal Uncle     ROS: Constipation but no fevers or chills, productive cough, hemoptysis, dysphasia, odynophagia, melena, hematochezia, dysuria, hematuria, rash, seizure activity, orthopnea, PND, pedal edema, claudication. Remaining systems are negative.  Physical Exam:   Blood pressure 96/60, pulse 64, height 5\' 1"  (1.549 m), weight 117 lb 6.4 oz (53.3 kg), SpO2  100 %.  General:  Well developed/well nourished in NAD Skin warm/dry Patient not depressed No peripheral clubbing Back-normal HEENT-normal/normal eyelids Neck supple/normal carotid upstroke bilaterally; no bruits; no JVD; no thyromegaly chest - CTA/ normal expansion CV - RRR/normal S1 and S2; no murmurs, rubs or gallops;  PMI nondisplaced Abdomen -NT/ND, no HSM, no mass, + bowel sounds, no bruit 2+ femoral pulses, no bruits Ext-no edema, chords, 2+ DP Neuro-grossly nonfocal  ECG -normal sinus rhythm at a rate of 64, no ST changes.  Personally reviewed  A/P  1 chest pain-symptoms atypical.  Possibly secondary to musculoskeletal pain or fibromyalgia.  Her electrocardiogram shows no ST changes.  She does not have exertional chest pain.  I will arrange an echocardiogram to assess LV function and rule out wall motion abnormality.  If normal we will not pursue further cardiac evaluation at this time.  Also note she has no risk factors for coronary disease.  2 history of SLE and Sjogren's-follow-up with rheumatology.  Kirk Ruths, MD

## 2021-12-31 ENCOUNTER — Other Ambulatory Visit: Payer: Self-pay

## 2021-12-31 ENCOUNTER — Ambulatory Visit: Payer: BC Managed Care – PPO | Admitting: Cardiology

## 2021-12-31 ENCOUNTER — Encounter: Payer: Self-pay | Admitting: Cardiology

## 2021-12-31 VITALS — BP 96/60 | HR 64 | Ht 61.0 in | Wt 117.4 lb

## 2021-12-31 DIAGNOSIS — R072 Precordial pain: Secondary | ICD-10-CM | POA: Diagnosis not present

## 2021-12-31 NOTE — Patient Instructions (Signed)
  Testing/Procedures:  Your physician has requested that you have an echocardiogram. Echocardiography is a painless test that uses sound waves to create images of your heart. It provides your doctor with information about the size and shape of your heart and how well your heart's chambers and valves are working. This procedure takes approximately one hour. There are no restrictions for this procedure. [address]   Follow-Up: At [facility], you and your health needs are our priority.  As part of our continuing mission to provide you with exceptional heart care, we have created designated Provider Care Teams.  These Care Teams include your primary Cardiologist (physician) and Advanced Practice Providers (APPs -  Physician Assistants and Nurse Practitioners) who all work together to provide you with the care you need, when you need it.  We recommend signing up for the patient portal called "MyChart".  Sign up information is provided on this After Visit Summary.  MyChart is used to connect with patients for Virtual Visits (Telemedicine).  Patients are able to view lab/test results, encounter notes, upcoming appointments, etc.  Non-urgent messages can be sent to your provider as well.   To learn more about what you can do with MyChart, go to [email or web address].    Your next appointment:    AS NEEDED 

## 2022-01-11 ENCOUNTER — Other Ambulatory Visit: Payer: Self-pay | Admitting: Obstetrics & Gynecology

## 2022-01-11 DIAGNOSIS — R928 Other abnormal and inconclusive findings on diagnostic imaging of breast: Secondary | ICD-10-CM

## 2022-01-13 ENCOUNTER — Ambulatory Visit (HOSPITAL_COMMUNITY): Payer: BC Managed Care – PPO | Attending: Cardiology

## 2022-01-13 ENCOUNTER — Other Ambulatory Visit: Payer: Self-pay

## 2022-01-13 DIAGNOSIS — R072 Precordial pain: Secondary | ICD-10-CM | POA: Diagnosis not present

## 2022-01-13 LAB — ECHOCARDIOGRAM COMPLETE
Area-P 1/2: 3.12 cm2
S' Lateral: 2.6 cm

## 2022-01-22 ENCOUNTER — Ambulatory Visit
Admission: RE | Admit: 2022-01-22 | Discharge: 2022-01-22 | Disposition: A | Discharge status: Home / Self Care | Payer: BC Managed Care – PPO | Source: Ambulatory Visit | Attending: Obstetrics & Gynecology | Admitting: Obstetrics & Gynecology

## 2022-01-22 ENCOUNTER — Other Ambulatory Visit: Payer: Self-pay | Admitting: Obstetrics & Gynecology

## 2022-01-22 DIAGNOSIS — N644 Mastodynia: Secondary | ICD-10-CM

## 2022-01-22 DIAGNOSIS — R921 Mammographic calcification found on diagnostic imaging of breast: Secondary | ICD-10-CM

## 2022-01-22 DIAGNOSIS — R928 Other abnormal and inconclusive findings on diagnostic imaging of breast: Secondary | ICD-10-CM

## 2022-02-03 ENCOUNTER — Ambulatory Visit
Admission: RE | Admit: 2022-02-03 | Discharge: 2022-02-03 | Disposition: A | Discharge status: Home / Self Care | Payer: BC Managed Care – PPO | Source: Ambulatory Visit | Attending: Obstetrics & Gynecology | Admitting: Obstetrics & Gynecology

## 2022-02-03 DIAGNOSIS — R921 Mammographic calcification found on diagnostic imaging of breast: Secondary | ICD-10-CM

## 2022-03-08 ENCOUNTER — Other Ambulatory Visit: Payer: Self-pay

## 2022-03-08 ENCOUNTER — Encounter (HOSPITAL_BASED_OUTPATIENT_CLINIC_OR_DEPARTMENT_OTHER): Payer: Self-pay | Admitting: Emergency Medicine

## 2022-03-08 ENCOUNTER — Emergency Department (HOSPITAL_BASED_OUTPATIENT_CLINIC_OR_DEPARTMENT_OTHER)
Admission: EM | Admit: 2022-03-08 | Discharge: 2022-03-09 | Disposition: A | Discharge status: Home / Self Care | Payer: BC Managed Care – PPO | Attending: Emergency Medicine | Admitting: Emergency Medicine

## 2022-03-08 DIAGNOSIS — S161XXA Strain of muscle, fascia and tendon at neck level, initial encounter: Secondary | ICD-10-CM | POA: Diagnosis not present

## 2022-03-08 DIAGNOSIS — F0781 Postconcussional syndrome: Secondary | ICD-10-CM | POA: Insufficient documentation

## 2022-03-08 DIAGNOSIS — Y92410 Unspecified street and highway as the place of occurrence of the external cause: Secondary | ICD-10-CM | POA: Insufficient documentation

## 2022-03-08 DIAGNOSIS — S0990XA Unspecified injury of head, initial encounter: Secondary | ICD-10-CM | POA: Insufficient documentation

## 2022-03-08 DIAGNOSIS — S199XXA Unspecified injury of neck, initial encounter: Secondary | ICD-10-CM | POA: Diagnosis present

## 2022-03-08 MED ORDER — IBUPROFEN 400 MG PO TABS
400.0000 mg | ORAL_TABLET | Freq: Once | ORAL | Status: AC | PRN
  Administered 2022-03-08: 400 mg via ORAL
  Filled 2022-03-08: qty 1
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
## 2022-03-08 NOTE — ED Notes (Signed)
3pm- tylenol last dose ?

## 2022-03-09 ENCOUNTER — Emergency Department (HOSPITAL_BASED_OUTPATIENT_CLINIC_OR_DEPARTMENT_OTHER): Payer: BC Managed Care – PPO

## 2022-03-09 MED ORDER — KETOROLAC TROMETHAMINE 30 MG/ML IJ SOLN
30.0000 mg | Freq: Once | INTRAMUSCULAR | Status: AC
  Administered 2022-03-09: 30 mg via INTRAMUSCULAR
  Filled 2022-03-09: qty 1

## 2022-03-09 MED ORDER — ONDANSETRON 4 MG PO TBDP
8.0000 mg | ORAL_TABLET | Freq: Once | ORAL | Status: AC
  Administered 2022-03-09: 8 mg via ORAL
  Filled 2022-03-09: qty 2

## 2022-03-09 NOTE — ED Notes (Signed)
Patient transported to CT 

## 2022-04-13 ENCOUNTER — Encounter: Payer: Self-pay | Admitting: Neurology

## 2022-04-13 ENCOUNTER — Ambulatory Visit: Payer: BC Managed Care – PPO | Admitting: Neurology

## 2022-04-13 VITALS — BP 113/73 | HR 75 | Ht 61.0 in | Wt 118.5 lb

## 2022-04-13 DIAGNOSIS — G4701 Insomnia due to medical condition: Secondary | ICD-10-CM | POA: Diagnosis not present

## 2022-04-13 DIAGNOSIS — M79601 Pain in right arm: Secondary | ICD-10-CM

## 2022-04-13 DIAGNOSIS — R42 Dizziness and giddiness: Secondary | ICD-10-CM

## 2022-04-13 DIAGNOSIS — F0781 Postconcussional syndrome: Secondary | ICD-10-CM

## 2022-04-13 DIAGNOSIS — S134XXD Sprain of ligaments of cervical spine, subsequent encounter: Secondary | ICD-10-CM

## 2022-04-13 DIAGNOSIS — R4589 Other symptoms and signs involving emotional state: Secondary | ICD-10-CM | POA: Diagnosis not present

## 2022-04-13 DIAGNOSIS — M79602 Pain in left arm: Secondary | ICD-10-CM | POA: Insufficient documentation

## 2022-04-13 DIAGNOSIS — S134XXA Sprain of ligaments of cervical spine, initial encounter: Secondary | ICD-10-CM | POA: Insufficient documentation

## 2022-04-13 MED ORDER — DONEPEZIL HCL 5 MG PO TABS: 5.0000 mg | ORAL_TABLET | Freq: Every day | ORAL | 1 refills | Status: DC

## 2022-04-13 MED ORDER — CYCLOBENZAPRINE HCL 5 MG PO TABS: 5.0000 mg | ORAL_TABLET | Freq: Three times a day (TID) | ORAL | 1 refills | Status: DC | PRN
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
## 2022-04-22 ENCOUNTER — Telehealth: Payer: Self-pay | Admitting: Neurology

## 2022-04-22 DIAGNOSIS — F0781 Postconcussional syndrome: Secondary | ICD-10-CM

## 2022-04-22 DIAGNOSIS — S134XXD Sprain of ligaments of cervical spine, subsequent encounter: Secondary | ICD-10-CM

## 2022-04-22 DIAGNOSIS — R42 Dizziness and giddiness: Secondary | ICD-10-CM

## 2022-04-22 NOTE — Telephone Encounter (Signed)
Pt called wanting to know what the update is on the MRI of the Brain and Cervical Spine is. Pt states that she is willing to call her insurance company if she is needing to so that they can be approved. Please advise.

## 2022-04-22 NOTE — Telephone Encounter (Signed)
Spoke to patient and she wants to go to [facility]. MRIs are approved I will faxed when I get back the signed orders.
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
## 2022-04-27 NOTE — Telephone Encounter (Signed)
scheduled 6/23 9am

## 2022-05-20 ENCOUNTER — Encounter: Payer: Self-pay | Admitting: Diagnostic Neuroimaging

## 2022-05-24 ENCOUNTER — Telehealth: Payer: Self-pay | Admitting: Neurology

## 2022-05-24 NOTE — Telephone Encounter (Signed)
Study performed through [facility].  A result note was made available 05-23-2022: The actual Images were not visible to me:  NO abnormalities seen on MRI brain: few , punctuate white matter foci, non- demyelinating , no acute strokes.   No changes consistent with TBI.

## 2022-05-24 NOTE — Telephone Encounter (Signed)
Received the impression reports from the MRIs patient completed on 05/20/22.  MRI of the brain w and w/out contrast 1) No acute intracranial abnormality   2) Few scatter small foci of t2 flair hyperintensity in the cerebral whitematter. These lesions are nonspecific and usually associated with patients who had prior trauma, migraines, HTN,HLD)  MRI of cervical spine with and w/out contrast: No significant cervical spinal canal or foraminal stenosis.  C4-5 shows mild degenerative disc disease  Will have Dr Dohmeier review the complete report and provide the results for the patient.

## 2022-07-14 ENCOUNTER — Encounter: Payer: Self-pay | Admitting: Neurology

## 2022-07-14 ENCOUNTER — Ambulatory Visit: Payer: BC Managed Care – PPO | Admitting: Neurology

## 2022-07-14 VITALS — BP 109/62 | HR 72 | Ht 61.0 in | Wt 117.0 lb

## 2022-07-14 DIAGNOSIS — G43711 Chronic migraine without aura, intractable, with status migrainosus: Secondary | ICD-10-CM | POA: Insufficient documentation

## 2022-07-14 DIAGNOSIS — F410 Panic disorder [episodic paroxysmal anxiety] without agoraphobia: Secondary | ICD-10-CM | POA: Diagnosis not present

## 2022-07-14 DIAGNOSIS — H9313 Tinnitus, bilateral: Secondary | ICD-10-CM

## 2022-07-14 MED ORDER — DONEPEZIL HCL 10 MG PO TABS: 10.0000 mg | ORAL_TABLET | Freq: Every day | ORAL | 3 refills | Status: DC

## 2022-07-14 MED ORDER — SERTRALINE HCL 25 MG PO TABS: 25.0000 mg | ORAL_TABLET | Freq: Every day | ORAL | 5 refills | Status: DC

## 2022-07-14 MED ORDER — SUMATRIPTAN SUCCINATE 25 MG PO TABS: 25.0000 mg | ORAL_TABLET | ORAL | 0 refills | Status: DC | PRN

## 2022-07-14 MED ORDER — TIZANIDINE HCL 2 MG PO TABS: 2.0000 mg | ORAL_TABLET | Freq: Two times a day (BID) | ORAL | 3 refills | Status: DC | PRN
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
## 2022-07-14 NOTE — Patient Instructions (Signed)
Traumatic Brain Injury Traumatic brain injury (TBI) is an injury to the brain that results from: A hard, direct hit to the head (closed injury). An object penetrating the skull and entering the brain (open injury). Traumatic brain injury is also  a term for a concussion. TBI can be mild, moderate, or severe. What are the causes? Common causes of this condition include: Falls. Motor vehicle accidents. Sports injuries. Assaults. What increases the risk? You are more likely to develop this condition if you: Are 49 years old or older. Are a man. Play contact sports, especially football, hockey, or soccer. Are in the [employer]. Are a victim of violence. Abuse drugs or alcohol. Have had a previous TBI. What are the signs or symptoms? Symptoms may vary from person to person, and may include: Loss of consciousness. Headache. Confusion. Fatigue. Changes in sleep. Dizziness. Mood or personality changes. Memory problems. Nausea or vomiting or both. Seizures. Clumsiness. Slurred speech. Depression and anxiety. Anger. Trouble concentrating, organizing, or making decisions. Inability to control emotions or actions (impulse control). Loss of or dulling of the senses, such as hearing, vision, and touch. This can include: Blurred vision. Ringing in your ears. How is this diagnosed? This condition may be diagnosed based on: Medical history and physical exam. Neurologic exam. This checks for brain and nervous system function, including your reflexes, memory, and coordination. CT scan. Your TBI may be described as mild, moderate, or severe. How is this treated? Treatment depends on the severity of your brain injury and may include: Breathing support (mechanical ventilation). Blood pressure medicines. Pain medicines. Treatments to decrease the swelling in your brain. Brain surgery. This may be needed to: Remove a blood clot. Repair bleeding. Remove an object that has penetrated the  brain, such as a skull fragment or a bullet. Treatment of TBI also includes: Physical and mental rest. Careful observation. Medicine. You may be prescribed medicines to help with symptoms such as headaches, nausea, or difficulty sleeping. Physical, occupational, and speech therapy. Referral to a concussion clinic or rehabilitation center. Follow these instructions at home: Medicines Take over-the-counter and prescription medicines only as told by your health care provider. Do not take blood thinners (anticoagulants), aspirin, or other anti-inflammatory medicines such as ibuprofen or naproxen unless approved by your health care provider. Activity Rest. Rest helps the brain to heal. Make sure you: Get plenty of sleep. Most adults should get 7-9 hours of sleep each night. Rest during the day. Take daytime naps or rest breaks when you feel tired. Do not do high-risk activities that could cause a second concussion, such as riding a bike or playing sports. Having another concussion before the first one has healed can be dangerous. Avoid a lot of visual stimulation. This includes work on the computer or phone, watching TV, and reading. Ask your health care provider what kind of activities are safe for you. Your ability to react may be slower after a brain injury. Never do these activities if you are dizzy. Your health care provider will likely give you a plan for gradually returning to activities. General instructions Do not drink alcohol. Watch your symptoms and tell others to do the same. Complications sometimes occur after a brain injury. Older adults with a brain injury may have a higher risk of serious complications. Seek support from friends and family. Keep all follow-up visits as directed by your health care provider. This is important. Contact a health care provider if: Your symptoms get worse or they do not improve. You have  new symptoms. You have another injury. Get help right away  if: You have: Severe, persistent headaches that are not relieved by medicine. Weakness or numbness in any part of your body. Confusion. Slurred speech. Difficulty waking up. Nausea or persistent vomiting. A feeling like you are moving when you are not (vertigo). Seizures or you faint. Changes in your vision. Clear or bloody discharge from your nose or ears. You cannot use your arms or legs normally. Summary Traumatic brain injury happens when there is a hard, direct hit to the head or when an object penetrates the skull and enters the brain. Traumatic brain injuries may be mild, moderate, or severe. Treatment depends on the severity of your injury. Rest is one of the best treatments. Do not return to activity until your health care provider approves. Get help right away if you have a head injury and you develop seizures, confusion, vomiting, weakness in the arms or legs, slurred speech, and other symptoms. This information is not intended to replace advice given to you by your health care provider. Make sure you discuss any questions you have with your health care provider. Document Revised: 02/01/2021 Document Reviewed: 02/01/2021 Elsevier Patient Education  [place]. Sumatriptan Tablets What is this medication? SUMATRIPTAN (soo ma TRIP tan) treats migraines. It works by blocking pain signals and narrowing blood vessels in the brain. It belongs to a group of medications called triptans. It is not used to prevent migraines. This medicine may be used for other purposes; ask your health care provider or pharmacist if you have questions. COMMON BRAND NAME(S): Imitrex, Migraine Pack What should I tell my care team before I take this medication? They need to know if you have any of these conditions: Cigarette smoker Circulation problems in fingers and toes Heart disease High blood pressure High blood sugar (diabetes) High cholesterol History of irregular heartbeat History of  stroke Kidney disease Liver disease Stomach or intestine problems An unusual or allergic reaction to sumatriptan, other medications, foods, dyes, or preservatives Pregnant or trying to get pregnant Breast-feeding How should I use this medication? Take this medication by mouth with a glass of water. Follow the directions on the prescription label. Do not take it more often than directed. Talk to your care team regarding the use of this medication in children. Special care may be needed. Overdosage: If you think you have taken too much of this medicine contact a poison control center or emergency room at once. NOTE: This medicine is only for you. Do not share this medicine with others. What if I miss a dose? This does not apply. This medication is not for regular use. What may interact with this medication? Do not take this medication with any of the following: Certain medications for migraine headache like almotriptan, eletriptan, frovatriptan, naratriptan, rizatriptan, sumatriptan, zolmitriptan Ergot alkaloids like dihydroergotamine, ergonovine, ergotamine, methylergonovine MAOIs like Carbex, Eldepryl, Marplan, Nardil, and Parnate This medication may also interact with the following: Certain medications for depression, anxiety, or psychotic disorders This list may not describe all possible interactions. Give your health care provider a list of all the medicines, herbs, non-prescription drugs, or dietary supplements you use. Also tell them if you smoke, drink alcohol, or use illegal drugs. Some items may interact with your medicine. What should I watch for while using this medication? Visit your care team for regular checks on your progress. Tell your care team if your symptoms do not start to get better or if they get worse. You may  get drowsy or dizzy. Do not drive, use machinery, or do anything that needs mental alertness until you know how this medication affects you. Do not stand up or  sit up quickly, especially if you are an older patient. This reduces the risk of dizzy or fainting spells. Alcohol may interfere with the effect of this medication. Tell your care team right away if you have any change in your eyesight. If you take migraine medications for 10 or more days a month, your migraines may get worse. Keep a diary of headache days and medication use. Contact your care team if your migraine attacks occur more frequently. What side effects may I notice from receiving this medication? Side effects that you should report to your care team as soon as possible: Allergic reactions--skin rash, itching, hives, swelling of the face, lips, tongue, or throat Burning, pain, tingling, or color changes in the legs or feet Heart attack--pain or tightness in the chest, shoulders, arms, or jaw, nausea, shortness of breath, cold or clammy skin, feeling faint or lightheaded Heart rhythm changes--fast or irregular heartbeat, dizziness, feeling faint or lightheaded, chest pain, trouble breathing Increase in blood pressure Raynaud's--cool, numb, or painful fingers or toes that may change color from pale, to blue, to red Seizures Serotonin syndrome--irritability, confusion, fast or irregular heartbeat, muscle stiffness, twitching muscles, sweating, high fever, seizure, chills, vomiting, diarrhea Stroke--sudden numbness or weakness of the face, arm, or leg, trouble speaking, confusion, trouble walking, loss of balance or coordination, dizziness, severe headache, change in vision Sudden or severe stomach pain, nausea, vomiting, fever, or bloody diarrhea Vision loss Side effects that usually do not require medical attention (report to your care team if they continue or are bothersome): Dizziness General discomfort or fatigue This list may not describe all possible side effects. Call your doctor for medical advice about side effects. You may report side effects to FDA at 1-800-FDA-1088. Where  should I keep my medication? Keep out of the reach of children and pets. Store at room temperature between 2 and 30 degrees C (36 and 86 degrees F). Throw away any unused medication after the expiration date. NOTE: This sheet is a summary. It may not cover all possible information. If you have questions about this medicine, talk to your doctor, pharmacist, or health care provider.  2023 Elsevier/Gold Standard (2008-01-05 00:00:00)

## 2022-08-20 NOTE — Progress Notes (Signed)
OUTSIDE IMAGE  NOVANT : Normal cervical cord MRI, no impingement, stenosis or demyelination. 05-13-2022.

## 2022-09-01 ENCOUNTER — Other Ambulatory Visit: Payer: Self-pay | Admitting: Obstetrics & Gynecology

## 2022-09-01 DIAGNOSIS — R921 Mammographic calcification found on diagnostic imaging of breast: Secondary | ICD-10-CM

## 2022-09-06 ENCOUNTER — Encounter (INDEPENDENT_AMBULATORY_CARE_PROVIDER_SITE_OTHER): Payer: Self-pay | Admitting: Gastroenterology

## 2022-09-15 ENCOUNTER — Ambulatory Visit (INDEPENDENT_AMBULATORY_CARE_PROVIDER_SITE_OTHER): Payer: BC Managed Care – PPO | Admitting: Gastroenterology

## 2022-10-08 ENCOUNTER — Emergency Department (HOSPITAL_BASED_OUTPATIENT_CLINIC_OR_DEPARTMENT_OTHER): Payer: BC Managed Care – PPO | Admitting: Radiology

## 2022-10-08 ENCOUNTER — Emergency Department (HOSPITAL_BASED_OUTPATIENT_CLINIC_OR_DEPARTMENT_OTHER)
Admission: EM | Admit: 2022-10-08 | Discharge: 2022-10-08 | Disposition: A | Discharge status: Home / Self Care | Payer: BC Managed Care – PPO | Attending: Emergency Medicine | Admitting: Emergency Medicine

## 2022-10-08 ENCOUNTER — Encounter (HOSPITAL_BASED_OUTPATIENT_CLINIC_OR_DEPARTMENT_OTHER): Payer: Self-pay

## 2022-10-08 ENCOUNTER — Other Ambulatory Visit: Payer: Self-pay

## 2022-10-08 DIAGNOSIS — R053 Chronic cough: Secondary | ICD-10-CM | POA: Diagnosis not present

## 2022-10-08 DIAGNOSIS — R519 Headache, unspecified: Secondary | ICD-10-CM | POA: Insufficient documentation

## 2022-10-08 DIAGNOSIS — R059 Cough, unspecified: Secondary | ICD-10-CM | POA: Diagnosis present

## 2022-10-08 LAB — COMPREHENSIVE METABOLIC PANEL
ALT: 49 U/L — ABNORMAL HIGH (ref 0–44)
AST: 41 U/L (ref 15–41)
Albumin: 4.4 g/dL (ref 3.5–5.0)
Alkaline Phosphatase: 62 U/L (ref 38–126)
Anion gap: 7 (ref 5–15)
BUN: 14 mg/dL (ref 6–20)
CO2: 28 mmol/L (ref 22–32)
Calcium: 9.6 mg/dL (ref 8.9–10.3)
Chloride: 104 mmol/L (ref 98–111)
Creatinine, Ser: 0.47 mg/dL (ref 0.44–1.00)
GFR, Estimated: >60 mL/min (ref >60)
Glucose, Bld: 91 mg/dL (ref 70–99)
Potassium: 3.8 mmol/L (ref 3.5–5.1)
Sodium: 139 mmol/L (ref 135–145)
Total Bilirubin: 0.8 mg/dL (ref 0.3–1.2)
Total Protein: 7.9 g/dL (ref 6.5–8.1)

## 2022-10-08 LAB — CBC WITH DIFFERENTIAL/PLATELET
Abs Immature Granulocytes: 0.01 10*3/uL (ref 0.00–0.07)
Basophils Absolute: 0 10*3/uL (ref 0.0–0.1)
Basophils Relative: 1 %
Eosinophils Absolute: 0.1 10*3/uL (ref 0.0–0.5)
Eosinophils Relative: 2 %
HCT: 37.3 % (ref 36.0–46.0)
Hemoglobin: 12.4 g/dL (ref 12.0–15.0)
Immature Granulocytes: 0 %
Lymphocytes Relative: 29 %
Lymphs Abs: 1.3 10*3/uL (ref 0.7–4.0)
MCH: 31 pg (ref 26.0–34.0)
MCHC: 33.2 g/dL (ref 30.0–36.0)
MCV: 93.3 fL (ref 80.0–100.0)
Monocytes Absolute: 0.4 10*3/uL (ref 0.1–1.0)
Monocytes Relative: 9 %
Neutro Abs: 2.6 10*3/uL (ref 1.7–7.7)
Neutrophils Relative %: 59 %
Platelets: 240 10*3/uL (ref 150–400)
RBC: 4 MIL/uL (ref 3.87–5.11)
RDW: 12.6 % (ref 11.5–15.5)
WBC: 4.4 10*3/uL (ref 4.0–10.5)
nRBC: 0 % (ref 0.0–0.2)

## 2022-10-08 MED ORDER — ALBUTEROL SULFATE HFA 108 (90 BASE) MCG/ACT IN AERS: 2.0000 | INHALATION_SPRAY | RESPIRATORY_TRACT | 0 refills | Status: DC | PRN

## 2022-10-08 MED ORDER — METHYLPREDNISOLONE 4 MG PO TBPK: ORAL_TABLET | ORAL | 0 refills | Status: DC

## 2022-10-08 MED ORDER — OMEPRAZOLE 20 MG PO CPDR: 20.0000 mg | DELAYED_RELEASE_CAPSULE | Freq: Every day | ORAL | 0 refills | Status: DC

## 2022-10-08 NOTE — ED Triage Notes (Signed)
Pt reports she was recently treated for Community acquired pneumonia, sinus infection, and bronchitis. Pt states she has had a productive cough, sore throat, fatigue, and headache for about 1 month.

## 2022-10-08 NOTE — Discharge Instructions (Addendum)
We saw you in the ER for the cough. It is unclear why you are having cough.  Your cough has not been present for at least 4 weeks.  We are prescribing you Medrol Dosepak along with inhaler and omeprazole.  We recommend that you follow-up with your PCP in 2 weeks, as you might need more testing or different treatment if your symptoms persist.

## 2022-10-08 NOTE — ED Provider Notes (Signed)
[facility] EMERGENCY DEPT Provider Note   CSN: [id number] Arrival date & time: 10/08/22  0848     History  Chief Complaint  Patient presents with   Cough    Gail [name] is a 51 y.o. female.  HPI    51 year old female comes in with chief complaint of cough.  Patient indicates that she has been having cough for the last month.  Patient has been treated for pneumonia, with a 10-day course of Levaquin that was ended 2 weeks ago.  Despite that she continues to have cough.  She is also been given albuterol, prednisone and cough prescription medications that have not helped.  Patient states that the cough is worse at night and in the morning.  It is sometimes causing her to have headaches.  She has discussed this with her PCP, who has recommended wait and watch approach.  Patient does have a history of GERD and states that she has had cough in the past because of it.  She is not on any lisinopril.  Past medical history of lupus, Sjogren's disease and fibromyalgia.  Home Medications Prior to Admission medications   Medication Sig Start Date End Date Taking? Authorizing Provider  albuterol (VENTOLIN HFA) 108 (90 Base) MCG/ACT inhaler Inhale 2 puffs into the lungs every 4 (four) hours as needed for wheezing or shortness of breath. 10/08/22  Yes Remingtyn Depaola, MD  methylPREDNISolone (MEDROL DOSEPAK) 4 MG TBPK tablet Day 1: 8 mg PO before breakfast, 4 mg after lunch and after dinner, and 8 mg at bedtime  Day 2: 4 mg PO before breakfast, after lunch, and after dinner and 8 mg at bedtime  Day 3: 4 mg PO before breakfast, after lunch, after dinner, and at bedtime  Day 4: 4 mg PO before breakfast, after lunch, and at bedtime  Day 5: 4 mg PO before breakfast and at bedtime  Day 6: 4 mg PO before breakfast 10/08/22  Yes Auburn Hert, MD  omeprazole (PRILOSEC) 20 MG capsule Take 1 capsule (20 mg total) by mouth daily. 10/08/22  Yes Varney Biles, MD  Biotin 10 MG  CAPS Take by mouth daily.    [provider]  cyanocobalamin (,VITAMIN B-12,) 1000 MCG/ML injection Inject 1,000 mcg into the muscle See admin instructions. Inject 1000 mcg once every 30 -60 days 04/09/13   [provider]  donepezil (ARICEPT) 10 MG tablet Take 1 tablet (10 mg total) by mouth at bedtime. 07/14/22   Dohmeier, Asencion Partridge, MD  sertraline (ZOLOFT) 25 MG tablet Take 1 tablet (25 mg total) by mouth daily. 07/14/22   Dohmeier, Asencion Partridge, MD  SUMAtriptan (IMITREX) 25 MG tablet Take 1 tablet (25 mg total) by mouth every 2 (two) hours as needed for migraine. May repeat in 2 hours if headache persists or recurs. 07/14/22   Dohmeier, Asencion Partridge, MD  tiZANidine (ZANAFLEX) 2 MG tablet Take 1 tablet (2 mg total) by mouth 2 (two) times daily as needed for muscle spasms. 07/14/22   Dohmeier, Asencion Partridge, MD      Allergies    Other    Review of Systems   Review of Systems  Constitutional:  Negative for activity change.  Respiratory:  Negative for shortness of breath and wheezing.   Cardiovascular:  Negative for chest pain.  Gastrointestinal:  Negative for abdominal pain, nausea and vomiting.  Genitourinary:  Negative for dysuria.  Musculoskeletal:  Negative for neck pain.  Neurological:  Negative for headaches.  All other systems reviewed and are negative.  Physical Exam Updated Vital Signs BP 104/73 (BP Location: Right Arm)   Pulse 60   Temp 98.4 F (36.9 C)   Resp 16   Ht '5\' 1"'$  (1.549 m)   Wt 56.7 kg   SpO2 100%   BMI 23.62 kg/m  Physical Exam  ED Results / Procedures / Treatments   Labs (all labs ordered are listed, but only abnormal results are displayed) Labs Reviewed  COMPREHENSIVE METABOLIC PANEL - Abnormal; Notable for the following components:      Result Value   ALT 49 (*)    All other components within normal limits  CBC WITH DIFFERENTIAL/PLATELET    EKG None  Radiology DG Chest 2 View  Result Date: 10/08/2022 CLINICAL DATA:  Cough EXAM: CHEST - 2 VIEW  COMPARISON:  None Available. FINDINGS: The heart size and mediastinal contours are within normal limits. No focal pulmonary opacity. No pleural effusion or pneumothorax. Surgical clips visualized in the right upper quadrant. No acute osseous abnormality. IMPRESSION: No acute cardiopulmonary abnormality. Electronically Signed   By: Beryle Flock M.D.   On: 10/08/2022 09:25    Procedures Procedures    Medications Ordered in ED Medications - No data to display  ED Course/ Medical Decision Making/ A&P                           Medical Decision Making Problems Addressed: Chronic cough: chronic illness or injury  Amount and/or Complexity of Data Reviewed Labs: ordered. Radiology: ordered.  Risk Prescription drug management.  51 year old patient comes in with chief complaint of cough.  Cough is been present now for a month.  She has been treated with antibiotics and with steroids and inhaler.  She is taking over-the-counter medications and trying home remedies without any relief.  Does have a history of GERD.  She does have a history of lupus and Sjogren's syndrome.  Lung exam is completely clear.  Patient's x-ray ordered and independently interpreted.  No evidence of pneumonia.  Differential diagnosis in addition to pneumonia, includes medication side effects, GERD, autoimmune flareup.  I will put her on Medrol Dosepak, give her albuterol inhaler and advised continued home remedy.  I also recommend that she follows up with PCP in 2 weeks, if she still having symptoms then they need to start treating this like a chronic cough and look for other etiologies besides infection.  Patient does not need antibiotics, she is in agreement with that.  Final Clinical Impression(s) / ED Diagnoses Final diagnoses:  Chronic cough    Rx / DC Orders ED Discharge Orders          Ordered    omeprazole (PRILOSEC) 20 MG capsule  Daily        10/08/22 1156    albuterol (VENTOLIN HFA) 108 (90 Base)  MCG/ACT inhaler  Every 4 hours PRN        10/08/22 1156    methylPREDNISolone (MEDROL DOSEPAK) 4 MG TBPK tablet        10/08/22 1156              Varney Biles, MD 10/08/22 1234

## 2022-10-31 ENCOUNTER — Encounter (INDEPENDENT_AMBULATORY_CARE_PROVIDER_SITE_OTHER): Payer: Self-pay | Admitting: Gastroenterology

## 2022-10-31 ENCOUNTER — Encounter (INDEPENDENT_AMBULATORY_CARE_PROVIDER_SITE_OTHER): Payer: Self-pay | Admitting: *Deleted

## 2022-10-31 ENCOUNTER — Ambulatory Visit (INDEPENDENT_AMBULATORY_CARE_PROVIDER_SITE_OTHER): Payer: BC Managed Care – PPO | Admitting: Gastroenterology

## 2022-10-31 VITALS — BP 103/61 | HR 79 | Temp 98.7°F | Ht 61.0 in | Wt 122.6 lb

## 2022-10-31 DIAGNOSIS — R1310 Dysphagia, unspecified: Secondary | ICD-10-CM | POA: Diagnosis not present

## 2022-10-31 DIAGNOSIS — R1013 Epigastric pain: Secondary | ICD-10-CM | POA: Insufficient documentation

## 2022-10-31 DIAGNOSIS — R6881 Early satiety: Secondary | ICD-10-CM

## 2022-10-31 DIAGNOSIS — K581 Irritable bowel syndrome with constipation: Secondary | ICD-10-CM | POA: Diagnosis not present

## 2022-10-31 MED ORDER — LUBIPROSTONE 8 MCG PO CAPS: 8.0000 ug | ORAL_CAPSULE | Freq: Two times a day (BID) | ORAL | 1 refills | Status: DC

## 2022-10-31 MED ORDER — SUCRALFATE 1 G PO TABS: 1.0000 g | ORAL_TABLET | Freq: Three times a day (TID) | ORAL | 1 refills | Status: DC
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
## 2022-10-31 NOTE — Patient Instructions (Signed)
We will get you scheduled for upper endoscopy for further evaluation of your symptoms I have sent carafate 1g for you to take 30 minutes prior to meals and at bedtime, as discussed, you can dissolve the tablet in 1/2 oz of water, mix and drink the slurry as this will work better to coat your GI tract Please stop linzess and start amitiaz 90mg twice daily. Try to increase water intake as well as fruits, veggies, whole grains, kiwi and prunes are great for constipation  Follow up 3 months

## 2022-11-04 ENCOUNTER — Ambulatory Visit (INDEPENDENT_AMBULATORY_CARE_PROVIDER_SITE_OTHER): Payer: BC Managed Care – PPO | Admitting: Pulmonary Disease

## 2022-11-04 ENCOUNTER — Encounter (HOSPITAL_BASED_OUTPATIENT_CLINIC_OR_DEPARTMENT_OTHER): Payer: Self-pay | Admitting: Pulmonary Disease

## 2022-11-04 VITALS — BP 98/62 | HR 70 | Temp 98.5°F | Ht 61.0 in | Wt 121.2 lb

## 2022-11-04 DIAGNOSIS — R052 Subacute cough: Secondary | ICD-10-CM | POA: Diagnosis not present

## 2022-11-04 MED ORDER — BENZONATATE 200 MG PO CAPS: 200.0000 mg | ORAL_CAPSULE | Freq: Two times a day (BID) | ORAL | 1 refills | Status: DC | PRN
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
## 2022-11-04 NOTE — Patient Instructions (Signed)
You may have a post bronchitis cough  Trial of chlortrimeton 4 mg at bedtime x 3 weeks (Can use combination [facility] brand 'sinus PE')  X Rx for benzonatate '200mg'$  thrice daily as needed for cough # 60 x 1 refill  OK to use OTC delsym for cough

## 2022-11-16 ENCOUNTER — Other Ambulatory Visit (HOSPITAL_COMMUNITY)
Admission: RE | Admit: 2022-11-16 | Discharge: 2022-11-16 | Disposition: A | Discharge status: Home / Self Care | Payer: BC Managed Care – PPO | Source: Ambulatory Visit | Attending: Gastroenterology | Admitting: Gastroenterology

## 2022-11-16 DIAGNOSIS — R1013 Epigastric pain: Secondary | ICD-10-CM | POA: Insufficient documentation

## 2022-11-16 DIAGNOSIS — R1110 Vomiting, unspecified: Secondary | ICD-10-CM | POA: Diagnosis not present

## 2022-11-16 DIAGNOSIS — B9681 Helicobacter pylori [H. pylori] as the cause of diseases classified elsewhere: Secondary | ICD-10-CM | POA: Diagnosis not present

## 2022-11-16 DIAGNOSIS — M3500 Sicca syndrome, unspecified: Secondary | ICD-10-CM | POA: Diagnosis not present

## 2022-11-16 DIAGNOSIS — R6881 Early satiety: Secondary | ICD-10-CM | POA: Diagnosis not present

## 2022-11-16 DIAGNOSIS — K581 Irritable bowel syndrome with constipation: Secondary | ICD-10-CM | POA: Diagnosis not present

## 2022-11-16 DIAGNOSIS — K31A19 Gastric intestinal metaplasia without dysplasia, unspecified site: Secondary | ICD-10-CM | POA: Diagnosis not present

## 2022-11-16 DIAGNOSIS — K2970 Gastritis, unspecified, without bleeding: Secondary | ICD-10-CM | POA: Diagnosis not present

## 2022-11-16 DIAGNOSIS — R1310 Dysphagia, unspecified: Secondary | ICD-10-CM | POA: Diagnosis not present

## 2022-11-16 DIAGNOSIS — M329 Systemic lupus erythematosus, unspecified: Secondary | ICD-10-CM | POA: Diagnosis not present

## 2022-11-16 LAB — PREGNANCY, URINE: Preg Test, Ur: NEGATIVE

## 2022-11-18 ENCOUNTER — Ambulatory Visit (HOSPITAL_COMMUNITY): Payer: BC Managed Care – PPO | Admitting: Anesthesiology

## 2022-11-18 ENCOUNTER — Ambulatory Visit (HOSPITAL_COMMUNITY)
Admission: RE | Admit: 2022-11-18 | Discharge: 2022-11-18 | Disposition: A | Discharge status: Home / Self Care | Payer: BC Managed Care – PPO | Source: Ambulatory Visit | Attending: Gastroenterology | Admitting: Gastroenterology

## 2022-11-18 ENCOUNTER — Other Ambulatory Visit: Payer: Self-pay

## 2022-11-18 ENCOUNTER — Encounter (HOSPITAL_COMMUNITY): Payer: Self-pay | Admitting: Gastroenterology

## 2022-11-18 ENCOUNTER — Encounter (HOSPITAL_COMMUNITY)
Admission: RE | Disposition: A | Discharge status: Home / Self Care | Payer: Self-pay | Source: Ambulatory Visit | Attending: Gastroenterology

## 2022-11-18 DIAGNOSIS — B9681 Helicobacter pylori [H. pylori] as the cause of diseases classified elsewhere: Secondary | ICD-10-CM | POA: Insufficient documentation

## 2022-11-18 DIAGNOSIS — G8929 Other chronic pain: Secondary | ICD-10-CM

## 2022-11-18 DIAGNOSIS — R109 Unspecified abdominal pain: Secondary | ICD-10-CM

## 2022-11-18 DIAGNOSIS — R1110 Vomiting, unspecified: Secondary | ICD-10-CM | POA: Insufficient documentation

## 2022-11-18 DIAGNOSIS — K3189 Other diseases of stomach and duodenum: Secondary | ICD-10-CM

## 2022-11-18 DIAGNOSIS — K2970 Gastritis, unspecified, without bleeding: Secondary | ICD-10-CM | POA: Insufficient documentation

## 2022-11-18 DIAGNOSIS — M3500 Sicca syndrome, unspecified: Secondary | ICD-10-CM | POA: Insufficient documentation

## 2022-11-18 DIAGNOSIS — R6881 Early satiety: Secondary | ICD-10-CM | POA: Insufficient documentation

## 2022-11-18 DIAGNOSIS — K581 Irritable bowel syndrome with constipation: Secondary | ICD-10-CM | POA: Insufficient documentation

## 2022-11-18 DIAGNOSIS — R1310 Dysphagia, unspecified: Secondary | ICD-10-CM | POA: Insufficient documentation

## 2022-11-18 DIAGNOSIS — R1013 Epigastric pain: Secondary | ICD-10-CM | POA: Insufficient documentation

## 2022-11-18 DIAGNOSIS — K31A19 Gastric intestinal metaplasia without dysplasia, unspecified site: Secondary | ICD-10-CM | POA: Insufficient documentation

## 2022-11-18 DIAGNOSIS — M329 Systemic lupus erythematosus, unspecified: Secondary | ICD-10-CM | POA: Insufficient documentation

## 2022-11-18 HISTORY — PX: BIOPSY: SHX5522

## 2022-11-18 HISTORY — PX: ESOPHAGOGASTRODUODENOSCOPY (EGD) WITH PROPOFOL: SHX5813

## 2022-11-18 SURGERY — ESOPHAGOGASTRODUODENOSCOPY (EGD) WITH PROPOFOL
Anesthesia: General

## 2022-11-18 MED ORDER — LIDOCAINE HCL (CARDIAC) PF 100 MG/5ML IV SOSY
PREFILLED_SYRINGE | INTRAVENOUS | Status: DC | PRN
  Administered 2022-11-18: 50 mg via INTRATRACHEAL

## 2022-11-18 MED ORDER — LACTATED RINGERS IV SOLN: INTRAVENOUS | Status: DC | PRN

## 2022-11-18 MED ORDER — LACTATED RINGERS IV SOLN: INTRAVENOUS | Status: DC

## 2022-11-18 MED ORDER — PROPOFOL 10 MG/ML IV BOLUS
INTRAVENOUS | Status: DC | PRN
  Administered 2022-11-18: 40 mg via INTRAVENOUS
  Administered 2022-11-18: 70 mg via INTRAVENOUS

## 2022-11-18 NOTE — Transfer of Care (Signed)
Immediate Anesthesia Transfer of Care Note  Patient: Gail [name]  Procedure(s) Performed: ESOPHAGOGASTRODUODENOSCOPY (EGD) WITH PROPOFOL BIOPSY  Patient Location: Endoscopy Unit  Anesthesia Type:General  Level of Consciousness: awake, alert , oriented, and patient cooperative  Airway & Oxygen Therapy: Patient Spontanous Breathing  Post-op Assessment: Report given to RN, Post -op Vital signs reviewed and stable, and Patient moving all extremities  Post vital signs: Reviewed and stable  Last Vitals:  Vitals Value Taken Time  BP 80/42 11/18/22 1321  Temp 36.5 C 11/18/22 1321  Pulse 62 11/18/22 1321  Resp 20 11/18/22 1321  SpO2 99 % 11/18/22 1321    Last Pain:  Vitals:   11/18/22 1321  TempSrc: Oral  PainSc: 0-No pain      Patients Stated Pain Goal: 9 (00/16/42 9037)  Complications: No notable events documented.

## 2022-11-18 NOTE — Anesthesia Postprocedure Evaluation (Signed)
Anesthesia Post Note  Patient: Gail [name]  Procedure(s) Performed: ESOPHAGOGASTRODUODENOSCOPY (EGD) WITH PROPOFOL BIOPSY  Patient location during evaluation: Phase II Anesthesia Type: General Level of consciousness: awake Pain management: pain level controlled Vital Signs Assessment: post-procedure vital signs reviewed and stable Respiratory status: spontaneous breathing and respiratory function stable Cardiovascular status: blood pressure returned to baseline and stable Postop Assessment: no headache and no apparent nausea or vomiting Anesthetic complications: no Comments: Late entry   No notable events documented.   Last Vitals:  Vitals:   11/18/22 1324 11/18/22 1325  BP: (!) 89/54 (!) 94/56  Pulse:    Resp:    Temp:    SpO2:      Last Pain:  Vitals:   11/18/22 1321  TempSrc: Oral  PainSc: 0-No pain                 Louann Sjogren

## 2022-11-18 NOTE — Interval H&P Note (Signed)
History and Physical Interval Note:  11/18/2022 12:51 PM  Gail [name]  has presented today for surgery, with the diagnosis of epigatsric pain, early satiety.  The various methods of treatment have been discussed with the patient and family. After consideration of risks, benefits and other options for treatment, the patient has consented to  Procedure(s) with comments: ESOPHAGOGASTRODUODENOSCOPY (EGD) WITH PROPOFOL (N/A) - 1:30pm, asa 2 ESOPHAGEAL DILATION (N/A) as a surgical intervention.  The patient's history has been reviewed, patient examined, no change in status, stable for surgery.  I have reviewed the patient's chart and labs.  Questions were answered to the patient's satisfaction.     [name]

## 2022-11-18 NOTE — Op Note (Signed)
[facility] Patient Name: Gail [name] Procedure Date: 11/18/2022 12:52 PM MRN: [id number] Date of Birth: [birth year] Attending MD: [name] , , [id number] CSN: [id number] Age: 51 Admit Type: Outpatient Procedure:                Upper GI endoscopy Indications:              Epigastric abdominal pain, Regurgitation Providers:                [name], Crystal Page, Raphael Gibney,                            Technician Referring MD:              Medicines:                Monitored Anesthesia Care Complications:            No immediate complications. Estimated Blood Loss:     Estimated blood loss: none. Procedure:                Pre-Anesthesia Assessment:                           - Prior to the procedure, a History and Physical                            was performed, and patient medications, allergies                            and sensitivities were reviewed. The patient's                            tolerance of previous anesthesia was reviewed.                           - The risks and benefits of the procedure and the                            sedation options and risks were discussed with the                            patient. All questions were answered and informed                            consent was obtained.                           - ASA Grade Assessment: II - A patient with mild                            systemic disease.                           After obtaining informed consent, the endoscope was                            passed under direct vision. Throughout the  procedure, the patient's blood pressure, pulse, and                            oxygen saturations were monitored continuously. The                            GIF-H190 ([id number]) scope was introduced through the                            mouth, and advanced to the second part of duodenum.                            The upper GI endoscopy was accomplished  without                            difficulty. The patient tolerated the procedure                            well. Scope In: 1:10:44 PM Scope Out: 1:17:35 PM Total Procedure Duration: 0 hours 6 minutes 51 seconds  Findings:      The examined esophagus was normal.      Patchy mildly erythematous mucosa without bleeding was found in the       gastric fundus. Biopsies from body and antrum were taken with a cold       forceps for Helicobacter pylori testing.      The examined duodenum was normal. Biopsies were taken with a cold       forceps for histology. Impression:               - Normal esophagus.                           - Erythematous mucosa in the gastric fundus.                            Biopsied.                           - Normal examined duodenum. Biopsied. Moderate Sedation:      Per Anesthesia Care Recommendation:           - Discharge patient to home (ambulatory).                           - Await pathology results.                           - Consider pH impedance testing if normal pathology.                           - May consider management with SSRI.                           - Start a low FODMAP diet - can ask for a copy at  the GI clinic Procedure Code(s):        --- Professional ---                           [phone], Esophagogastroduodenoscopy, flexible,                            transoral; with biopsy, single or multiple Diagnosis Code(s):        --- Professional ---                           K31.89, Other diseases of stomach and duodenum                           R10.13, Epigastric pain                           R11.10, Vomiting, unspecified CPT copyright 2022 American Medical Association. All rights reserved. The codes documented in this report are preliminary and upon coder review may  be revised to meet current compliance requirements. [name], MD [name],  11/18/2022 1:26:20 PM This report has been signed  electronically. Number of Addenda: 0

## 2022-11-18 NOTE — Discharge Instructions (Signed)
You are being discharged to home.  We are waiting for your pathology results.  Consider pH impedance testing if normal pathology. May consider management with SSRI. Start a low FODMAP diet - can ask for a copy at the GI clinic

## 2022-11-18 NOTE — Anesthesia Preprocedure Evaluation (Signed)
Anesthesia Evaluation  Patient identified by MRN, date of birth, ID band Patient awake    Reviewed: Allergy & Precautions, H&P , NPO status , Patient's Chart, lab work & pertinent test results, reviewed documented beta blocker date and time   Airway Mallampati: II  TM Distance: >3 FB Neck ROM: full    Dental no notable dental hx.    Pulmonary neg pulmonary ROS   Pulmonary exam normal breath sounds clear to auscultation       Cardiovascular Exercise Tolerance: Good negative cardio ROS  Rhythm:regular Rate:Normal     Neuro/Psych  Headaches  Anxiety      Neuromuscular disease  negative psych ROS   GI/Hepatic negative GI ROS, Neg liver ROS,,,  Endo/Other  negative endocrine ROS    Renal/GU negative Renal ROS  negative genitourinary   Musculoskeletal   Abdominal   Peds  Hematology negative hematology ROS (+)   Anesthesia Other Findings   Reproductive/Obstetrics negative OB ROS                             Anesthesia Physical Anesthesia Plan  ASA: 2  Anesthesia Plan: General   Post-op Pain Management:    Induction:   PONV Risk Score and Plan: Propofol infusion  Airway Management Planned:   Additional Equipment:   Intra-op Plan:   Post-operative Plan:   Informed Consent: I have reviewed the patients History and Physical, chart, labs and discussed the procedure including the risks, benefits and alternatives for the proposed anesthesia with the patient or authorized representative who has indicated his/her understanding and acceptance.     Dental Advisory Given  Plan Discussed with: CRNA  Anesthesia Plan Comments:        Anesthesia Quick Evaluation

## 2022-11-23 ENCOUNTER — Other Ambulatory Visit (INDEPENDENT_AMBULATORY_CARE_PROVIDER_SITE_OTHER): Payer: Self-pay | Admitting: Gastroenterology

## 2022-11-23 DIAGNOSIS — B9681 Helicobacter pylori [H. pylori] as the cause of diseases classified elsewhere: Secondary | ICD-10-CM

## 2022-11-23 LAB — SURGICAL PATHOLOGY

## 2022-11-23 MED ORDER — OMEPRAZOLE 40 MG PO CPDR: 40.0000 mg | DELAYED_RELEASE_CAPSULE | Freq: Two times a day (BID) | ORAL | 0 refills | Status: DC

## 2022-11-23 MED ORDER — TETRACYCLINE HCL 500 MG PO CAPS: 500.0000 mg | ORAL_CAPSULE | Freq: Four times a day (QID) | ORAL | 0 refills | Status: AC

## 2022-11-23 MED ORDER — BISMUTH SUBSALICYLATE 262 MG PO TABS: 2.0000 | ORAL_TABLET | Freq: Four times a day (QID) | ORAL | 0 refills | Status: AC

## 2022-11-23 MED ORDER — METRONIDAZOLE 500 MG PO TABS: 500.0000 mg | ORAL_TABLET | Freq: Three times a day (TID) | ORAL | 0 refills | Status: AC

## 2022-11-24 ENCOUNTER — Ambulatory Visit (INDEPENDENT_AMBULATORY_CARE_PROVIDER_SITE_OTHER): Payer: BC Managed Care – PPO | Admitting: Gastroenterology

## 2022-11-30 ENCOUNTER — Encounter (HOSPITAL_COMMUNITY): Payer: Self-pay | Admitting: Gastroenterology

## 2022-12-05 ENCOUNTER — Telehealth (INDEPENDENT_AMBULATORY_CARE_PROVIDER_SITE_OTHER): Payer: Self-pay | Admitting: *Deleted

## 2022-12-06 NOTE — Telephone Encounter (Signed)
Thanks, please ask the patient to update [place] on her symptoms a week after finishing treatment.  Please also let her know that it is possible she may not achieve eradication of H. Pylori as she did not take the 4 medications recommended for this infection, but we will need to confirm this with a breath test in the future.

## 2022-12-06 NOTE — Telephone Encounter (Signed)
Yes, please ask her to finish it as she has almost finished the whole course.

## 2022-12-06 NOTE — Telephone Encounter (Signed)
Does patient need to finish treatment since she having symptoms that she thinks are side effects from meds. She has today and tomorrow left?

## 2022-12-06 NOTE — Telephone Encounter (Signed)
Patient started treatment for h pylori on 12/27. She reports she is taking tetracycline, flagyl and omeprazole. Did not take bismuth except for 2 doses the other day. She started having headaches, dizziness, confusion at times, joint pain after starting med and has gotten worse since taking med. Will finish meds tomorrow ( but did not take bismuth with treatment) she read side effects of meds and thinks it is coming from tetracycline. Reports she is still having stomach issues but not sure if treatment has helped due to other side effects she cannot really tell if she is better.   [phone]

## 2022-12-06 NOTE — Telephone Encounter (Signed)
Discussed with patient to finish treatment and call back in one week with update on how she is doing. She verbalized understanding.

## 2022-12-14 ENCOUNTER — Other Ambulatory Visit (INDEPENDENT_AMBULATORY_CARE_PROVIDER_SITE_OTHER): Payer: Self-pay | Admitting: *Deleted

## 2022-12-14 DIAGNOSIS — B9681 Helicobacter pylori [H. pylori] as the cause of diseases classified elsewhere: Secondary | ICD-10-CM

## 2022-12-14 NOTE — Telephone Encounter (Signed)
Dr. Jenetta Downer, discussed with patient and she does want  to try motegrity. She wants sent to [facility].  I also put in lab order for h pylori breath test for her to do 2 weeks after stopping ppi. She verbalized understanding.

## 2022-12-14 NOTE — Telephone Encounter (Signed)
Finished treatment and states dizziness is better and headaches are better. Joint pain in hands is better since finishing med. Stomach hurts/ burns in lower abdomen. when she eats spicy or greasy foods her stomach hurts. Quit amitiza because it caused nausea. Tried carafate and reports she stopped because it did not work.

## 2022-12-14 NOTE — Telephone Encounter (Signed)
Will need to check H. Pylori testing breath test - please ask her to stop her PPI for 2 weeks and perform the test. Unfortunately she seems to be too sensitive to any laxative (has not tolerated Amitiza, low dose Linzess, Trulance). We can try Motegrity if she is interested.

## 2022-12-15 ENCOUNTER — Other Ambulatory Visit (INDEPENDENT_AMBULATORY_CARE_PROVIDER_SITE_OTHER): Payer: Self-pay | Admitting: Gastroenterology

## 2022-12-15 DIAGNOSIS — K581 Irritable bowel syndrome with constipation: Secondary | ICD-10-CM

## 2022-12-15 MED ORDER — MOTEGRITY 2 MG PO TABS: 1.0000 | ORAL_TABLET | Freq: Every day | ORAL | 3 refills | Status: DC

## 2022-12-15 NOTE — Telephone Encounter (Signed)
Motegrity 2 mg sent

## 2022-12-16 ENCOUNTER — Telehealth (INDEPENDENT_AMBULATORY_CARE_PROVIDER_SITE_OTHER): Payer: Self-pay

## 2022-12-16 NOTE — Telephone Encounter (Signed)
Med needed pa and I sent through cover my meds. Approval letter copied from cover my meds below and I called and notified pt med was approved and faxed approval letter to [facility].

## 2022-12-16 NOTE — Telephone Encounter (Signed)
Thanks

## 2022-12-16 NOTE — Telephone Encounter (Signed)
Patient called to report that even though insurance has approved her Medication, her insurance co pay is $500.00 dollars. She can not afford this, So I gave her the website to the drug manufacturer and they have a saving card that she should be able to sign up for.This card should put her at a $15.00 Co payment. Patient states understanding and this does not work she was instructed to call our office back.

## 2022-12-16 NOTE — Telephone Encounter (Signed)
Notice of Approval Date: 12/16/2022 Roney Marion [address] [place], [place] [address] Plan Member Name: TOYA PALACIOS Plan Member ID: [id number] Prescriber Name: Roney Marion Prescriber Phone: [phone] Prescriber Fax: [phone] Plan-approved Criteria used for review: Motegrity, [place] (FA-PA) Dear Dayna Ramus: [facility]  received a request from your provider for coverage of Motegrity (prucalopride). As long as you remain covered by the [facility] and there are no changes to your plan benefits, this request is approved for the following time period: 12/16/2022 - 12/17/2023 Approvals may be subject to dosing limits in accordance with FDA approved labeling, evidence-based practice guidelines or your pharmacy plan benefits. If you have not already done so, you may ask your pharmacist to fill the prescription. If you have questions, please call Customer Care toll-free at the number on your [place] ID card toll-free at [phone]. Sincerely, [facility] cc: Dr. Roney Marion PA# [place] [phone] Non-Grandfathered [phone] SN [facility] is the pharmacy benefit manager administering the prescription drug benefits on behalf of the health plan or plan sponsor. This document contains references to brand-name prescription drugs that are trademarks or registered trademarks of pharmaceutical manufacturers not affiliated with [facility] . [phone] [id number] TDD/TTY: [phone] Other Languages and Formats If you need this document in a different language or format, need someone to read the letter to you, or need help understanding the letter, by contacting us at the number on your [place] ID card. Spanish: Si usted necesita asistencia o necesita hablar con alguien en Espaol, por favor llame al nmero gratuito de Servicio al [employer] ubicado en su tarjeta de identificacin de beneficios. Chinese  (simplified): ???????????????????????????????????? ???? Tagalog: Kung kailangan ninyo ng tulong o [employer] sa isang tao sa [place], [place] lamang na tumawag nang walang-bayad sa [place] sa [place] sa numero na nakasulat sa inyong ID kard ng benepisyo. [place]ajo: Sh7ka at'ohwol ei doodaii' din4k'ehgo l2 bi'ch8 haadeedziih [id number], t'11 sh--d7, t'1

## 2022-12-22 ENCOUNTER — Ambulatory Visit: Payer: BC Managed Care – PPO | Admitting: Pulmonary Disease

## 2022-12-27 ENCOUNTER — Other Ambulatory Visit (INDEPENDENT_AMBULATORY_CARE_PROVIDER_SITE_OTHER): Payer: Self-pay | Admitting: Gastroenterology

## 2022-12-27 DIAGNOSIS — B9681 Helicobacter pylori [H. pylori] as the cause of diseases classified elsewhere: Secondary | ICD-10-CM

## 2022-12-27 DIAGNOSIS — G8929 Other chronic pain: Secondary | ICD-10-CM

## 2022-12-27 LAB — H. PYLORI BREATH TEST: H. pylori Breath Test: NOT DETECTED

## 2022-12-27 MED ORDER — OMEPRAZOLE 40 MG PO CPDR: 40.0000 mg | DELAYED_RELEASE_CAPSULE | Freq: Every day | ORAL | 3 refills | Status: DC

## 2023-01-02 ENCOUNTER — Other Ambulatory Visit (INDEPENDENT_AMBULATORY_CARE_PROVIDER_SITE_OTHER): Payer: Self-pay | Admitting: Gastroenterology

## 2023-01-02 ENCOUNTER — Telehealth (INDEPENDENT_AMBULATORY_CARE_PROVIDER_SITE_OTHER): Payer: Self-pay

## 2023-01-02 DIAGNOSIS — K581 Irritable bowel syndrome with constipation: Secondary | ICD-10-CM

## 2023-01-02 MED ORDER — LINACLOTIDE 290 MCG PO CAPS: 290.0000 ug | ORAL_CAPSULE | Freq: Every day | ORAL | 3 refills | Status: DC

## 2023-01-02 NOTE — Telephone Encounter (Signed)
Motegrity still cost $ 400.00 with the co pay assistance card per patient. Patient has tried Linzess, Amitiza , and Trulance. Patient says she is currently taking Linzess 72 mcg bid and also taking senakot gummies daily. I offered to the patient the patient assistance for the Motegrity, she says she did not think she would qualify and did not want to proceed with this. Can we increase the linzess to the 290 mcg? If so please send to [facility]. Thanks

## 2023-01-02 NOTE — Telephone Encounter (Signed)
Yes, Linzess 290 mcg qday sent to pharmacy

## 2023-01-02 NOTE — Telephone Encounter (Signed)
Patient made aware.

## 2023-01-18 ENCOUNTER — Other Ambulatory Visit (INDEPENDENT_AMBULATORY_CARE_PROVIDER_SITE_OTHER): Payer: Self-pay | Admitting: *Deleted

## 2023-01-18 ENCOUNTER — Telehealth: Payer: Self-pay | Admitting: *Deleted

## 2023-01-18 DIAGNOSIS — K581 Irritable bowel syndrome with constipation: Secondary | ICD-10-CM

## 2023-01-18 MED ORDER — LINACLOTIDE 145 MCG PO CAPS: 145.0000 ug | ORAL_CAPSULE | Freq: Every day | ORAL | 1 refills | Status: DC

## 2023-01-18 NOTE — Telephone Encounter (Signed)
Left message to return call.  

## 2023-01-18 NOTE — Telephone Encounter (Signed)
Pt returned call [phone]

## 2023-01-18 NOTE — Telephone Encounter (Signed)
Patient taking linzess 290 mcg and wants to drop to 145 mcg. She thinks that dose worked better for her.  Last seen 10/31/22 and that note states to stop linzess and start amitiza 8 mcg bid. Then put back on linzess 290 due to not tolerating amitiza and trulance and motegrity covered with $400 co pay. Can patient switch to 145 mcg since she is saying 290 mcg too strong?

## 2023-01-18 NOTE — Telephone Encounter (Signed)
Patient notified and med sent to pharmacy.

## 2023-01-24 ENCOUNTER — Other Ambulatory Visit: Payer: Self-pay | Admitting: Obstetrics & Gynecology

## 2023-01-24 DIAGNOSIS — R921 Mammographic calcification found on diagnostic imaging of breast: Secondary | ICD-10-CM

## 2023-01-24 DIAGNOSIS — N6310 Unspecified lump in the right breast, unspecified quadrant: Secondary | ICD-10-CM

## 2023-01-30 ENCOUNTER — Ambulatory Visit (INDEPENDENT_AMBULATORY_CARE_PROVIDER_SITE_OTHER): Payer: BC Managed Care – PPO | Admitting: Gastroenterology

## 2023-01-30 ENCOUNTER — Telehealth (INDEPENDENT_AMBULATORY_CARE_PROVIDER_SITE_OTHER): Payer: Self-pay | Admitting: Gastroenterology

## 2023-01-30 ENCOUNTER — Encounter (INDEPENDENT_AMBULATORY_CARE_PROVIDER_SITE_OTHER): Payer: Self-pay | Admitting: Gastroenterology

## 2023-01-30 VITALS — BP 102/66 | HR 83 | Temp 98.6°F | Ht 61.0 in | Wt 126.3 lb

## 2023-01-30 DIAGNOSIS — K581 Irritable bowel syndrome with constipation: Secondary | ICD-10-CM

## 2023-01-30 DIAGNOSIS — Z8619 Personal history of other infectious and parasitic diseases: Secondary | ICD-10-CM | POA: Diagnosis not present

## 2023-01-30 DIAGNOSIS — R109 Unspecified abdominal pain: Secondary | ICD-10-CM | POA: Diagnosis not present

## 2023-01-30 DIAGNOSIS — G8929 Other chronic pain: Secondary | ICD-10-CM | POA: Diagnosis not present

## 2023-01-30 NOTE — Progress Notes (Signed)
Gail [name], M.D. Gastroenterology & Hepatology [facility] [address] [place], [place] [address]  Primary Care Physician: Gail Palms, MD [address] [place] [address]  I will communicate my assessment and recommendations to the referring MD via EMR.  Problems: H. Pylori s/p quadruple regimen and erradication IBS-C  History of Present Illness: Gail [name] is a 52 y.o. female with past medical history of H. Pylori, fibromyalgia, lupus, Sjogren's disease and IBS-C, who presents for follow up of H. Pylori and IBS-C.  The patient was last seen on 10/31/2022. At that time, the patient is scheduled to undergo EGD.  She was advised to stop Linzess and start Amitiza 8 mcg twice daily, was also advised to continue Carafate 1 g every 6 hours.  Underwent EGD on 11/18/2022 which showed normal esophagus, erythematous fundus.  Biopsies were positive for H. pylori.  Duodenal was normal, biopsies were within normal limits. Patient was prescribed a 2 week course of bismuth quadruple regimen but she did not tolerate bismuth She finished the rest of the medication for 2 weeks. Had negative breath test on 12/26/2022 which was negative - 2 weeks off PPI. She is now on omeprazole 40 mg qday.  She has some occasional discomfort intermittently in the periumbilical area with very seldom sharp pain that lasts for a few minutes.overall, states that her abdominal pain is better compared to prior but she still feels constant discomfort in her mid abdomen.  The patient denies having any nausea, vomiting, fever, chills, hematochezia, melena, hematemesis, abdominal pain, diarrhea, jaundice, pruritus or weight loss.   Patient reports that she has noticed having cold hands and discoloration of her hands when she takes Linzess 145 mcg daily. Due to this, she is only using it Friday to Sunday.patient reported this has worked better than [place].  States it has  helped her moving her bowels more regularly than if she was not taking the medications. She reports that the bloating improves some after she moves her bowels.  Last EGD: as above Last Colonoscopy:07/2020 One 2 mm polyp in the cecum.  - One 3 mm polyp in the transverse colon - One 2 mm polyp in the sigmoid colon - Non-bleeding internal hemorrhoids.  (3 tubular adenomas)  Recommend repeating in 5 years.  Past Medical History: Past Medical History:  Diagnosis Date   Fibromyalgia    Lupus ([place])    Sjogren's disease ([place])     Past Surgical History: Past Surgical History:  Procedure Laterality Date   APPENDECTOMY     BIOPSY  11/18/2022   Procedure: BIOPSY;  Surgeon: Harvel Quale, MD;  [facility];  Service: Gastroenterology;;   CHOLECYSTECTOMY     COLONOSCOPY     COLONOSCOPY WITH PROPOFOL N/A 08/26/2020   Procedure: COLONOSCOPY WITH PROPOFOL;  Surgeon: Harvel Quale, MD;  [facility];  Service: Gastroenterology;  Laterality: N/A;  815   ESOPHAGOGASTRODUODENOSCOPY (EGD) WITH PROPOFOL N/A 11/18/2022   Procedure: ESOPHAGOGASTRODUODENOSCOPY (EGD) WITH PROPOFOL;  Surgeon: Harvel Quale, MD;  [facility];  Service: Gastroenterology;  Laterality: N/A;  1:30pm, asa 2   POLYPECTOMY  08/26/2020   Procedure: POLYPECTOMY;  Surgeon: Harvel Quale, MD;  [facility];  Service: Gastroenterology;;   UPPER GASTROINTESTINAL ENDOSCOPY      Family History: Family History  Problem Relation Age of Onset   Other Mother        breast cyst   Heart disease Mother  Arthritis Father    Hyperlipidemia Father    Cancer Maternal Uncle        colon   Diabetes Paternal Uncle     Social History: Social History   Tobacco Use  Smoking Status Never   Passive exposure: Never  Smokeless Tobacco Never   Social History   Substance and Sexual Activity  Alcohol Use No   Social History   Substance and  Sexual Activity  Drug Use No    Allergies: Allergies  Allergen Reactions   Other     Mint -Patient states that if she eats anything with mint her stomach burns.    Medications: Current Outpatient Medications  Medication Sig Dispense Refill   BIOTIN PO Take 2,500 mcg by mouth every other day.     Carboxymethylcellulose Sodium (THERATEARS) 0.25 % SOLN Place 1 drop into both eyes daily.     cyanocobalamin (,VITAMIN B-12,) 1000 MCG/ML injection Inject 1,000 mcg into the muscle every 2 (two) months.     linaclotide (LINZESS) 145 MCG CAPS capsule Take 1 capsule (145 mcg total) by mouth daily before breakfast. 90 capsule 1   omeprazole (PRILOSEC) 40 MG capsule Take 1 capsule (40 mg total) by mouth daily. (Patient not taking: Reported on 01/30/2023) 90 capsule 3   sucralfate (CARAFATE) 1 g tablet Take 1 tablet (1 g total) by mouth 4 (four) times daily -  with meals and at bedtime. (Patient not taking: Reported on 11/17/2022) 120 tablet 1   No current facility-administered medications for this visit.    Review of Systems: GENERAL: negative for malaise, night sweats HEENT: No changes in hearing or vision, no nose bleeds or other nasal problems. NECK: Negative for lumps, goiter, pain and significant neck swelling RESPIRATORY: Negative for cough, wheezing CARDIOVASCULAR: Negative for chest pain, leg swelling, palpitations, orthopnea GI: SEE HPI MUSCULOSKELETAL: Negative for joint pain or swelling, back pain, and muscle pain. SKIN: Negative for lesions, rash PSYCH: Negative for sleep disturbance, mood disorder and recent psychosocial stressors. HEMATOLOGY Negative for prolonged bleeding, bruising easily, and swollen nodes. ENDOCRINE: Negative for cold or heat intolerance, polyuria, polydipsia and goiter. NEURO: negative for tremor, gait imbalance, syncope and seizures. The remainder of the review of systems is noncontributory.   Physical Exam: BP 102/66 (BP Location: Left Arm, Patient  Position: Sitting, Cuff Size: Normal)   Pulse 83   Temp 98.6 F (37 C) (Oral)   Ht '5\' 1"'$  (1.549 m)   Wt 126 lb 4.8 oz (57.3 kg)   BMI 23.86 kg/m  GENERAL: The patient is AO x3, in no acute distress. HEENT: Head is normocephalic and atraumatic. EOMI are intact. Mouth is well hydrated and without lesions. NECK: Supple. No masses LUNGS: Clear to auscultation. No presence of rhonchi/wheezing/rales. Adequate chest expansion HEART: RRR, normal s1 and s2. ABDOMEN: mildly tender to palpation in the epigastric and periumbilical area, no guarding, no peritoneal signs, and nondistended. BS +. No masses. EXTREMITIES: Without any cyanosis, clubbing, rash, lesions or edema. NEUROLOGIC: AOx3, no focal motor deficit. SKIN: no jaundice, no rashes  Imaging/Labs: as above  I personally reviewed and interpreted the available labs, imaging and endoscopic files.  Impression and Plan: Gail [name] is a 52 y.o. female with past medical history of H. Pylori, fibromyalgia, lupus, Sjogren's disease and IBS-C, who presents for follow up of H. Pylori and IBS-C.  The patient had successful eradication of H. pylori which led to significant improvement of her chronic abdominal complaints.  However, she is still having intermittent  discomfort in her abdomen.  Given the chronicity of her pain, it is possible that this is likely related to a functional abdominal pain but we will need to completely rule out other organic etiologies with cross-sectional abdominal imaging with a CT of the abdomen and pelvis with IV contrast.  For now, we will continue her omeprazole 40 mg every day to help alleviate her chronic abdominal complaint and she will continue on Linzess as needed as this has led to relief of her constipation.  -Schedule CT abdomen/pelvis with IV contrast -Continue omeprazole 40 mg qday -Continue Linzess 145 mcg as needed  All questions were answered.      Gail Peppers, MD Gastroenterology and  Hepatology [facility]

## 2023-01-30 NOTE — Telephone Encounter (Signed)
Pt scheduled for CT on 02/27/23 @ 10:30; pt will need to arrive at [facility] at 8:15 to begin drinking contrast. PA approved- [id number] DOS 01/30/23-02/28/23. NPO 4 hrs prior to CT.

## 2023-01-30 NOTE — Patient Instructions (Signed)
Schedule CT abdomen/pelvis with IV contrast Continue omeprazole 40 mg qday Continue Linzess 145 mcg as needed

## 2023-02-04 IMAGING — MG MM BREAST BX W/ LOC DEV 1ST LESION IMAGE BX SPEC STEREO GUIDE*R*
8 of 9 series · 8 of 17 positions shown · non-contrast
Comparison: Previous exams.
COMPARISON: Previous exams.

Addendum:
CLINICAL DATA: Patient with indeterminate right breast
calcifications.

EXAM:
RIGHT BREAST STEREOTACTIC CORE NEEDLE BIOPSY

[R (1 of 6)]
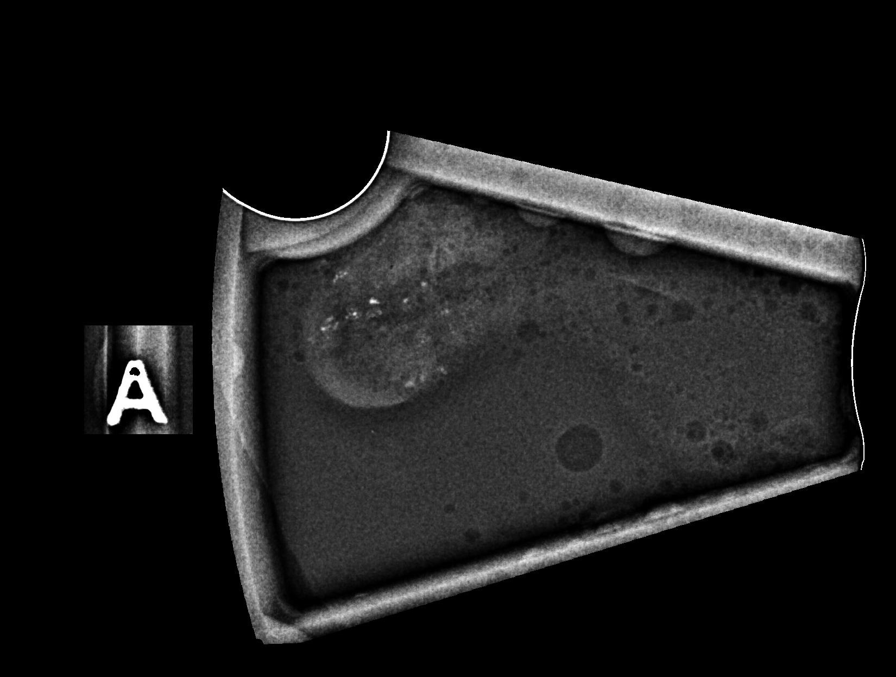

[R (2 of 6)]
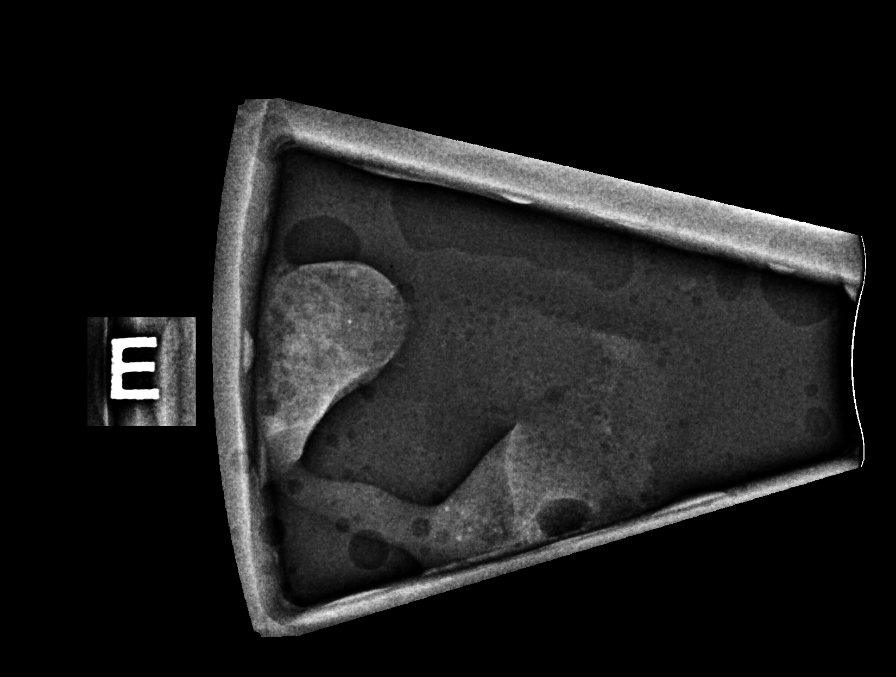

[R (3 of 6)]
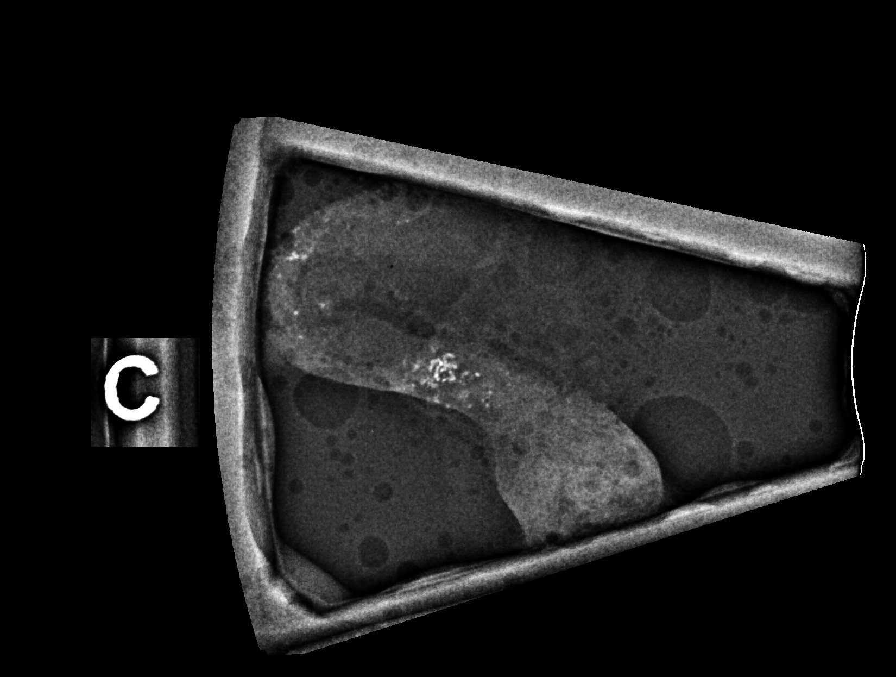

[R (4 of 6)]
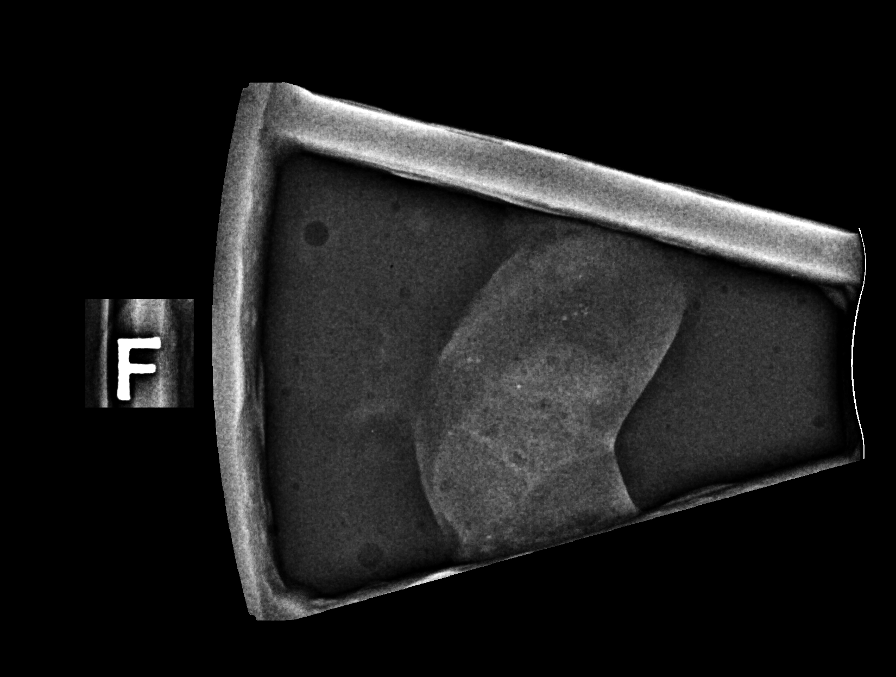

[R (5 of 6)]
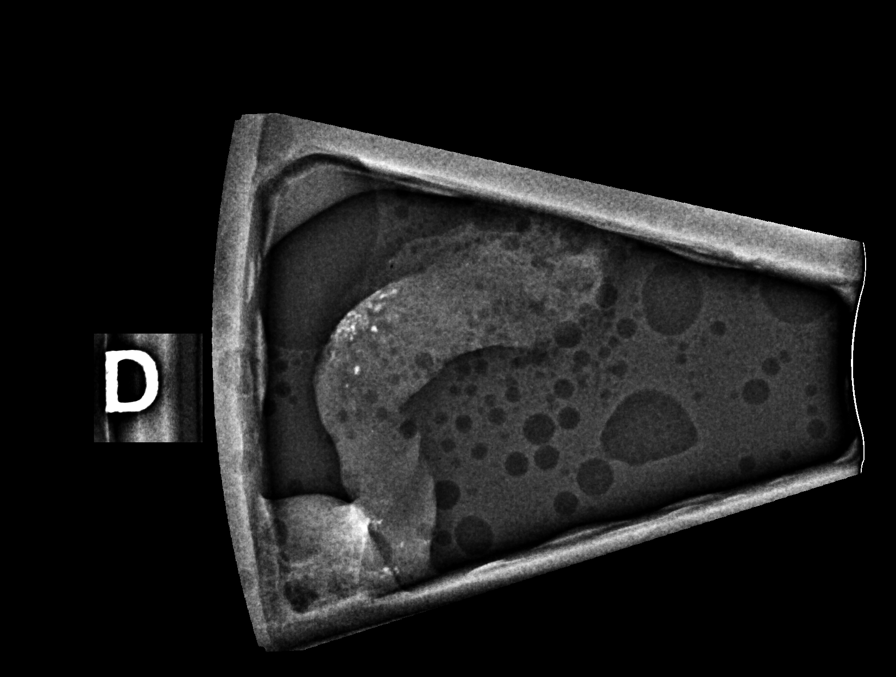

[R (6 of 6)]
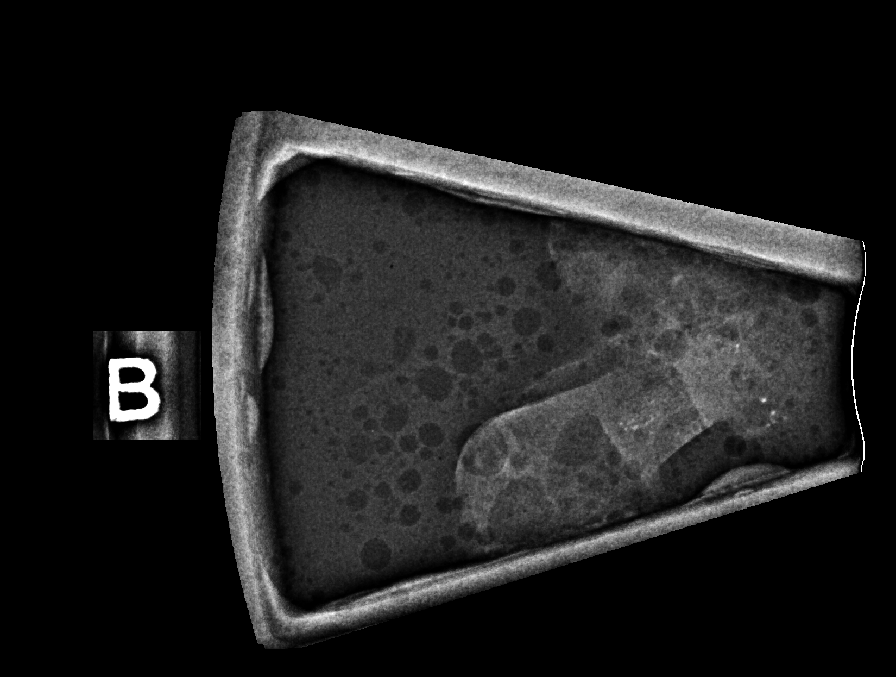

[R CC]
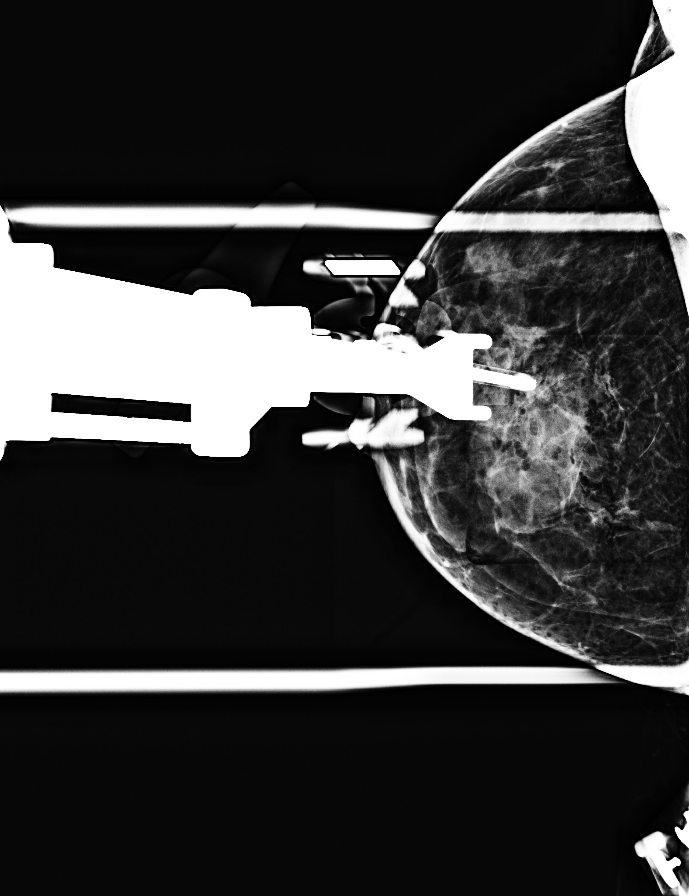

[R CC tomo · tomo slice 23/45.0]
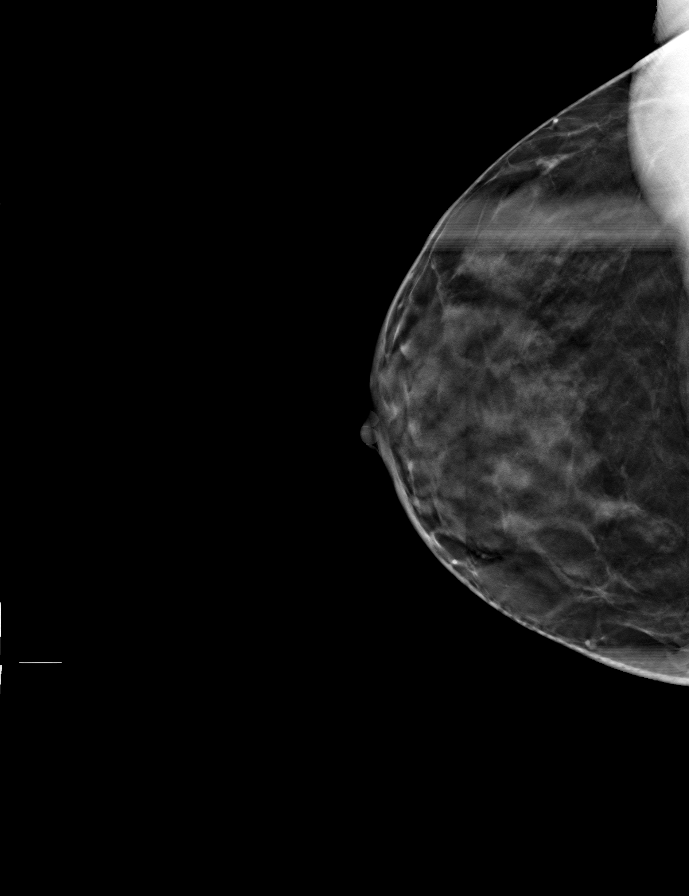

[8 of 17 positions shown; findings below may reference images not displayed]



Using sterile technique and 1% Lidocaine as local anesthetic, under
stereotactic guidance, a 9 gauge vacuum assisted device was used to
perform core needle biopsy of calcifications within the upper
slightly inner right breast using a cranial approach. Specimen
radiograph was performed showing calcifications. Specimens with
calcifications are identified for pathology.

Lesion quadrant: Upper inner quadrant

At the conclusion of the procedure, X shaped tissue marker clip was
deployed into the biopsy cavity. Follow-up 2-view mammogram was
performed and dictated separately.
IMPRESSION: Stereotactic-guided biopsy of right breast calcifications. No
apparent complications.

ADDENDUM:
Pathology revealed BENIGN BREAST WITH NONPROLIFERATIVE FIBROCYSTIC
CHANGES INCLUDING STROMAL FIBROSIS AND ADENOSIS, MICROCALCIFICATIONS
PRESENT WITHIN ADENOSIS AND CYSTICALLY DILATED DUCTS of the RIGHT
breast, upper inner, (x clip). This was found to be concordant by
Dr. Sjsantosa Tamiya.

Pathology results were discussed with the patient by telephone. The
patient reported doing well after the biopsy with tenderness and
shooting pains at the site. Post biopsy instructions and care were
reviewed and questions were answered. The patient was encouraged to
call The [REDACTED] for any additional
concerns. My direct phone number was provided.

The patient was instructed to return for RIGHT diagnostic
mammography in 6 months and informed a reminder notice would be sent
regarding this appointment.

Pathology results reported by Hioen Andrianus, RN on 02/04/2022.



Using sterile technique and 1% Lidocaine as local anesthetic, under
stereotactic guidance, a 9 gauge vacuum assisted device was used to
perform core needle biopsy of calcifications within the upper
slightly inner right breast using a cranial approach. Specimen
radiograph was performed showing calcifications. Specimens with
calcifications are identified for pathology.

Lesion quadrant: Upper inner quadrant

At the conclusion of the procedure, X shaped tissue marker clip was
deployed into the biopsy cavity. Follow-up 2-view mammogram was
performed and dictated separately.
IMPRESSION: Stereotactic-guided biopsy of right breast calcifications. No
apparent complications.
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
## 2023-02-07 ENCOUNTER — Ambulatory Visit: Payer: BC Managed Care – PPO | Admitting: Family Medicine

## 2023-02-07 ENCOUNTER — Telehealth: Payer: Self-pay | Admitting: Family Medicine

## 2023-02-07 ENCOUNTER — Encounter: Payer: Self-pay | Admitting: Family Medicine

## 2023-02-07 VITALS — BP 100/66 | HR 77 | Ht 61.0 in | Wt 125.4 lb

## 2023-02-07 DIAGNOSIS — F410 Panic disorder [episodic paroxysmal anxiety] without agoraphobia: Secondary | ICD-10-CM | POA: Diagnosis not present

## 2023-02-07 DIAGNOSIS — F419 Anxiety disorder, unspecified: Secondary | ICD-10-CM

## 2023-02-07 DIAGNOSIS — G4701 Insomnia due to medical condition: Secondary | ICD-10-CM

## 2023-02-07 DIAGNOSIS — F0781 Postconcussional syndrome: Secondary | ICD-10-CM

## 2023-02-07 DIAGNOSIS — G43711 Chronic migraine without aura, intractable, with status migrainosus: Secondary | ICD-10-CM | POA: Diagnosis not present

## 2023-02-07 DIAGNOSIS — G8929 Other chronic pain: Secondary | ICD-10-CM

## 2023-02-07 MED ORDER — GABAPENTIN 100 MG PO CAPS: 100.0000 mg | ORAL_CAPSULE | Freq: Three times a day (TID) | ORAL | 1 refills | Status: DC

## 2023-02-07 NOTE — Patient Instructions (Signed)
Below is our plan:  We will start gabapentin '100mg'$  daily at bedtime for 1 week. Increase dose by '100mg'$  weekly. Do not exceed '300mg'$  daily. I will send a referral to psychiatry and psychology to help with management of anxiety. Please continue close follow up with your rheumatologist.   Please make sure you are staying well hydrated. I recommend 50-60 ounces daily. Well balanced diet and regular exercise encouraged. Consistent sleep schedule with 6-8 hours recommended.   Please continue follow up with care team as directed.   Follow up with me in 6 months   You may receive a survey regarding today's visit. I encourage you to leave honest feed back as I do use this information to improve patient care. Thank you for seeing me today!

## 2023-02-07 NOTE — Telephone Encounter (Addendum)
Referrals for psychology and psychiatry fax to [facility]. Phone: [phone], Fax: [phone].

## 2023-02-16 ENCOUNTER — Ambulatory Visit: Admission: RE | Admit: 2023-02-16 | Payer: BC Managed Care – PPO | Source: Ambulatory Visit

## 2023-02-16 ENCOUNTER — Ambulatory Visit
Admission: RE | Admit: 2023-02-16 | Discharge: 2023-02-16 | Disposition: A | Discharge status: Home / Self Care | Payer: BC Managed Care – PPO | Source: Ambulatory Visit | Attending: Obstetrics & Gynecology | Admitting: Obstetrics & Gynecology

## 2023-02-16 DIAGNOSIS — N6310 Unspecified lump in the right breast, unspecified quadrant: Secondary | ICD-10-CM

## 2023-02-16 DIAGNOSIS — R921 Mammographic calcification found on diagnostic imaging of breast: Secondary | ICD-10-CM

## 2023-02-16 NOTE — Telephone Encounter (Signed)
Refaxing referral for psychology and psychiatry [facility] Phone: [phone], Fax: [phone]  Received call from [facility] declining referral due to do not treat anxiety only cognitive, neuropsychological testing

## 2023-02-27 ENCOUNTER — Encounter (HOSPITAL_COMMUNITY): Payer: Self-pay | Admitting: Radiology

## 2023-02-27 ENCOUNTER — Ambulatory Visit (HOSPITAL_COMMUNITY)
Admission: RE | Admit: 2023-02-27 | Discharge: 2023-02-27 | Disposition: A | Discharge status: Home / Self Care | Payer: BC Managed Care – PPO | Source: Ambulatory Visit | Attending: Gastroenterology | Admitting: Gastroenterology

## 2023-02-27 DIAGNOSIS — R109 Unspecified abdominal pain: Secondary | ICD-10-CM | POA: Insufficient documentation

## 2023-02-27 DIAGNOSIS — G8929 Other chronic pain: Secondary | ICD-10-CM | POA: Diagnosis present

## 2023-02-27 LAB — POCT I-STAT CREATININE: Creatinine, Ser: 0.4 mg/dL — ABNORMAL LOW (ref 0.44–1.00)

## 2023-02-27 MED ORDER — IOHEXOL 300 MG/ML  SOLN
80.0000 mL | Freq: Once | INTRAMUSCULAR | Status: AC | PRN
  Administered 2023-02-27: 80 mL via INTRAVENOUS

## 2023-05-09 ENCOUNTER — Encounter (HOSPITAL_BASED_OUTPATIENT_CLINIC_OR_DEPARTMENT_OTHER): Payer: Self-pay | Admitting: Obstetrics & Gynecology

## 2023-05-10 ENCOUNTER — Other Ambulatory Visit: Payer: Self-pay

## 2023-05-10 ENCOUNTER — Encounter (HOSPITAL_BASED_OUTPATIENT_CLINIC_OR_DEPARTMENT_OTHER): Payer: Self-pay | Admitting: Obstetrics & Gynecology

## 2023-05-10 NOTE — Progress Notes (Signed)
Your procedure is scheduled on Monday, 05/29/2023.  Report to [facility] AT  5:30 AM.   Call this number if you have problems the morning of surgery  :[phone].   OUR ADDRESS IS [address].  [facility] LOCATED IN [facility].  PLEASE BRING YOUR INSURANCE CARD AND PHOTO ID DAY OF SURGERY.  ONLY 2 PEOPLE ARE ALLOWED IN  WAITING  ROOM                                      REMEMBER:  DO NOT EAT FOOD, CANDY GUM OR MINTS  AFTER MIDNIGHT THE NIGHT BEFORE YOUR SURGERY . YOU MAY HAVE CLEAR LIQUIDS FROM MIDNIGHT THE NIGHT BEFORE YOUR SURGERY UNTIL  4:30 AM. NO CLEAR LIQUIDS AFTER   4:30 AM DAY OF SURGERY.  YOU MAY  BRUSH YOUR TEETH MORNING OF SURGERY AND RINSE YOUR MOUTH OUT, NO CHEWING GUM CANDY OR MINTS.     CLEAR LIQUID DIET    Allowed      Water                                                                   Coffee and tea, regular and decaf  (NO cream or milk products of any type, may sweeten)                         Carbonated beverages, regular and diet                                    Sports drinks like Gatorade _____________________________________________________________________     TAKE ONLY THESE MEDICATIONS MORNING OF SURGERY: Theratears if needed                                        DO NOT WEAR JEWERLY/  METAL/  PIERCINGS (INCLUDING NO PLASTIC PIERCINGS) DO NOT WEAR LOTIONS, POWDERS, PERFUMES OR NAIL POLISH ON YOUR FINGERNAILS. TOENAIL POLISH IS OK TO WEAR. DO NOT SHAVE FOR 48 HOURS PRIOR TO DAY OF SURGERY.  CONTACTS, GLASSES, OR DENTURES MAY NOT BE WORN TO SURGERY.  REMEMBER: NO SMOKING, VAPING ,  DRUGS OR ALCOHOL FOR 24 HOURS BEFORE YOUR SURGERY.                                    [facility] IS NOT RESPONSIBLE  FOR ANY BELONGINGS.                                                                    Marland Kitchen           [facility] - Preparing for Surgery  Before surgery, you can play an important role.  Because skin is not  sterile, your skin needs to be as free of germs as possible.  You can reduce the number of germs on your skin by washing with CHG (chlorahexidine gluconate) soap before surgery.  CHG is an antiseptic cleaner which kills germs and bonds with the skin to continue killing germs even after washing. Please DO NOT use if you have an allergy to CHG or antibacterial soaps.  If your skin becomes reddened/irritated stop using the CHG and inform your nurse when you arrive at Short Stay. Do not shave (including legs and underarms) for at least 48 hours prior to the first CHG shower.  You may shave your face/neck. Please follow these instructions carefully:  1.  Shower with CHG Soap the night before surgery and the  morning of Surgery.  2.  If you choose to wash your hair, wash your hair first as usual with your  normal  shampoo.  3.  After you shampoo, rinse your hair and body thoroughly to remove the  shampoo.                                        4.  Use CHG as you would any other liquid soap.  You can apply chg directly  to the skin and wash , chg soap provided, night before and morning of your surgery.  5.  Apply the CHG Soap to your body ONLY FROM THE NECK DOWN.   Do not use on face/ open                           Wound or open sores. Avoid contact with eyes, ears mouth and genitals (private parts).                       Wash face,  Genitals (private parts) with your normal soap.             6.  Wash thoroughly, paying special attention to the area where your surgery  will be performed.  7.  Thoroughly rinse your body with warm water from the neck down.  8.  DO NOT shower/wash with your normal soap after using and rinsing off  the CHG Soap.             9.  Pat yourself dry with a clean towel.            10.  Wear clean pajamas.            11.  Place clean sheets on your bed the night of your first shower and do not  sleep with pets. Day of Surgery : Do not apply any lotions/ powders the morning of  surgery.  Please wear clean clothes to the hospital/surgery center.  IF YOU HAVE ANY SKIN IRRITATION OR PROBLEMS WITH THE SURGICAL SOAP, PLEASE GET A BAR OF GOLD DIAL SOAP AND SHOWER THE NIGHT BEFORE YOUR SURGERY AND THE MORNING OF YOUR SURGERY. PLEASE LET THE NURSE KNOW MORNING OF YOUR SURGERY IF YOU HAD ANY PROBLEMS WITH THE SURGICAL SOAP.   YOUR SURGEON MAY HAVE REQUESTED EXTENDED RECOVERY TIME AFTER YOUR SURGERY. IT COULD BE A  JUST A FEW HOURS  UP TO AN OVERNIGHT STAY.  YOUR SURGEON SHOULD HAVE DISCUSSED THIS WITH YOU PRIOR TO  YOUR SURGERY. IN THE EVENT YOU NEED TO STAY OVERNIGHT PLEASE REFER TO THE FOLLOWING GUIDELINES. YOU MAY HAVE UP TO 4 VISITORS  MAY VISIT IN THE EXTENDED RECOVERY ROOM UNTIL 800 PM ONLY.  ONE  VISITOR AGE 80 AND OVER MAY SPEND THE NIGHT AND MUST BE IN EXTENDED RECOVERY ROOM NO LATER THAN 800 PM . YOUR DISCHARGE TIME AFTER YOU SPEND THE NIGHT IS 900 AM THE MORNING AFTER YOUR SURGERY. YOU MAY PACK A SMALL OVERNIGHT BAG WITH TOILETRIES FOR YOUR OVERNIGHT STAY IF YOU WISH.  REGARDLESS OF IF YOU STAY OVER NIGHT OR ARE DISCHARGED THE SAME DAY YOU WILL BE REQUIRED TO HAVE A RESPONSIBLE ADULT (18 YRS OLD OR OLDER) STAY WITH YOU FOR AT LEAST THE FIRST 24 HOURS  YOUR PRESCRIPTION MEDICATIONS WILL BE PROVIDED DURING YOUR HOSPITAL STAY.  ________________________________________________________________________                                                        QUESTIONS Mechele Claude PRE OP NURSE PHONE [phone].

## 2023-05-10 NOTE — Progress Notes (Signed)
Spoke w/ via phone for pre-op interview---Gail [name] needs dos----urine pregnancy per anesthesia, surgeon orders pending               [name] results------05/24/23 [name] appt for cbc, type & screen, 01/13/22 Echo in Epic COVID test -----patient states asymptomatic no test needed Arrive at -------0530 on Monday, 05/29/2023 NPO after MN NO Solid Food.  Clear liquids from MN until---0430 Med rec completed Medications to take morning of surgery -----Theratears prn Diabetic medication -----n/a Patient instructed no nail polish to be worn day of surgery Patient instructed to bring photo id and insurance card day of surgery Patient aware to have Driver (ride ) / caregiver    for 24 hours after surgery - husband, Gail [name] Patient Special Instructions -----Extended / overnight stay instructions given. Pre-Op special Instructions -----Requested orders from Dr. Langston Masker via Epic IB on 05/09/23. Patient verbalized understanding of instructions that were given at this phone interview. Patient denies shortness of breath, chest pain, fever, cough at this phone interview.

## 2023-05-24 ENCOUNTER — Encounter (HOSPITAL_COMMUNITY): Payer: BC Managed Care – PPO

## 2023-05-29 ENCOUNTER — Ambulatory Visit (HOSPITAL_BASED_OUTPATIENT_CLINIC_OR_DEPARTMENT_OTHER)
Admission: RE | Admit: 2023-05-29 | Payer: BC Managed Care – PPO | Source: Home / Self Care | Admitting: Obstetrics & Gynecology

## 2023-05-29 DIAGNOSIS — Z01818 Encounter for other preprocedural examination: Secondary | ICD-10-CM

## 2023-05-29 DIAGNOSIS — D251 Intramural leiomyoma of uterus: Secondary | ICD-10-CM

## 2023-05-29 HISTORY — DX: Tinnitus, unspecified ear: H93.19

## 2023-05-29 HISTORY — DX: Other chronic pain: G89.29

## 2023-05-29 HISTORY — DX: Gastro-esophageal reflux disease without esophagitis: K21.9

## 2023-05-29 HISTORY — DX: Trigger finger, unspecified finger: M65.30

## 2023-05-29 HISTORY — DX: Presence of spectacles and contact lenses: Z97.3

## 2023-05-29 HISTORY — DX: Emotional lability: R45.86

## 2023-05-29 HISTORY — DX: Deficiency of other specified B group vitamins: E53.8

## 2023-05-29 HISTORY — DX: Pneumonia, unspecified organism: J18.9

## 2023-05-29 HISTORY — DX: Irritable bowel syndrome, unspecified: K58.9

## 2023-05-29 HISTORY — DX: Pain in left shoulder: M25.512

## 2023-05-29 HISTORY — DX: Cervicalgia: M54.2

## 2023-05-29 HISTORY — DX: Headache, unspecified: R51.9

## 2023-05-29 HISTORY — DX: Postconcussional syndrome: F07.81

## 2023-05-29 HISTORY — DX: Vitamin D deficiency, unspecified: E55.9

## 2023-05-29 SURGERY — HYSTERECTOMY, TOTAL, ABDOMINAL, WITH SALPINGECTOMY
Anesthesia: General | Laterality: Bilateral

## 2023-06-08 ENCOUNTER — Encounter (INDEPENDENT_AMBULATORY_CARE_PROVIDER_SITE_OTHER): Payer: Self-pay | Admitting: Gastroenterology

## 2023-06-08 ENCOUNTER — Ambulatory Visit (INDEPENDENT_AMBULATORY_CARE_PROVIDER_SITE_OTHER): Payer: BC Managed Care – PPO | Admitting: Gastroenterology

## 2023-06-08 VITALS — BP 105/68 | HR 86 | Temp 97.8°F | Ht 61.0 in | Wt 127.9 lb

## 2023-06-08 DIAGNOSIS — R140 Abdominal distension (gaseous): Secondary | ICD-10-CM | POA: Diagnosis not present

## 2023-06-08 DIAGNOSIS — K581 Irritable bowel syndrome with constipation: Secondary | ICD-10-CM | POA: Diagnosis not present

## 2023-06-08 DIAGNOSIS — Z8619 Personal history of other infectious and parasitic diseases: Secondary | ICD-10-CM

## 2023-06-08 DIAGNOSIS — R12 Heartburn: Secondary | ICD-10-CM | POA: Diagnosis not present

## 2023-06-08 MED ORDER — IBSRELA 50 MG PO TABS
1.0000 | ORAL_TABLET | Freq: Two times a day (BID) | ORAL | 3 refills | Status: DC
Start: 2023-06-08 — End: 2023-08-14
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
## 2023-06-08 NOTE — Patient Instructions (Signed)
Stop Linzess Start Ibsrela twice a day Check sucrose breath test Schedule 4-hour gastric emptying study May consider SSRIs in the future depending on symptom response

## 2023-06-09 ENCOUNTER — Telehealth (INDEPENDENT_AMBULATORY_CARE_PROVIDER_SITE_OTHER): Payer: Self-pay | Admitting: Gastroenterology

## 2023-06-09 ENCOUNTER — Encounter (INDEPENDENT_AMBULATORY_CARE_PROVIDER_SITE_OTHER): Payer: Self-pay

## 2023-06-09 NOTE — Telephone Encounter (Signed)
Gastric Emptying study scheduled for 06/21/23 at 10:00am. NPO after midnight. Hold Ibsrela for 24 hours. Arrive 15 min early. Contacted pt. Pt wanted to know if we have anything for next month. Gave pt nuclear med phone number and informed pt that she could call and talk with them to see if they can get her scheduled for next month. Pt verbalized understanding.

## 2023-06-12 ENCOUNTER — Telehealth (INDEPENDENT_AMBULATORY_CARE_PROVIDER_SITE_OTHER): Payer: Self-pay

## 2023-06-12 ENCOUNTER — Telehealth (INDEPENDENT_AMBULATORY_CARE_PROVIDER_SITE_OTHER): Payer: Self-pay | Admitting: *Deleted

## 2023-06-12 NOTE — Telephone Encounter (Signed)
[facility] called on patient concerning isbrella and left message to call back on.  [phone].  I called back at 8 this morning and office was closed. Will call back after 9am today.   Pt has IBS-c and it was approved through covermy meds.

## 2023-06-12 NOTE — Telephone Encounter (Signed)
Left message with [employer] to return my call so we can discuss what question or issue they had about isbrella.

## 2023-06-12 NOTE — Telephone Encounter (Signed)
Date: 06/09/2023 Aldean Ast [facility] [address] [place], [place] [address] Plan Member Name: ANAVICTORIA WILK Plan Member ID: [id number] Prescriber Name: Aldean Ast [facility] Prescriber Phone: [phone] Prescriber Fax: [phone] Plan-approved Criteria used for review: Allena Napoleon NTM (FA-PA) Dear Lucky Rathke: [facility]  received a request from your provider for coverage of Ibsrela (tenapanor). As long as you remain covered by the [facility] and there are no changes to your plan benefits, this request is approved for the following time period: 06/09/2023 - 06/08/2024 Approvals may be subject to dosing limits in accordance with FDA approved labeling, evidence-based practice guidelines or your pharmacy plan benefits. If you have not already done so, you may ask your pharmacist to fill the prescription. If you have questions, please call Customer Care toll-free at the number on your [facility] Plan ID card toll-free at [phone]. Sincerely, [facility] cc: Dr. Aldean Ast [facility] PA# [facility] Health Plan [phone] Non-Grandfathered [phone] [place]

## 2023-06-12 NOTE — Telephone Encounter (Signed)
Called and spoke to crystal at [facility] and she states pt is getting isbrella [facility] at [facility] co pay and she spoke with patient on 7/12 and pt is aware

## 2023-06-20 ENCOUNTER — Other Ambulatory Visit (HOSPITAL_COMMUNITY): Payer: BC Managed Care – PPO

## 2023-06-26 ENCOUNTER — Telehealth (INDEPENDENT_AMBULATORY_CARE_PROVIDER_SITE_OTHER): Payer: Self-pay | Admitting: Gastroenterology

## 2023-06-26 NOTE — Telephone Encounter (Signed)
Hi Wendy,  Can you please call the patient and tell her the breath test for sucrose  (enzyme) deficiency was normal?  Thanks,  Katrinka Blazing, MD Gastroenterology and Hepatology [facility] Gastroenterology

## 2023-06-27 NOTE — Telephone Encounter (Signed)
Pt notified that test was normal.

## 2023-07-03 ENCOUNTER — Telehealth (INDEPENDENT_AMBULATORY_CARE_PROVIDER_SITE_OTHER): Payer: Self-pay | Admitting: *Deleted

## 2023-07-03 NOTE — Telephone Encounter (Signed)
Fax from [facility] assist. Pt has received isbella through [employer] assist program from [facility] on 06/23/23. Any questions mya call [phone] or fax at [phone]

## 2023-07-10 ENCOUNTER — Telehealth (INDEPENDENT_AMBULATORY_CARE_PROVIDER_SITE_OTHER): Payer: Self-pay | Admitting: *Deleted

## 2023-07-10 NOTE — Telephone Encounter (Signed)
Pt called in to cancel her GES. She does not feel like she needs it done and reports will discuss this when she comes in to see Dr. Levon Hedger. FYI

## 2023-07-10 NOTE — Telephone Encounter (Signed)
Thanks for the update

## 2023-07-12 ENCOUNTER — Encounter (INDEPENDENT_AMBULATORY_CARE_PROVIDER_SITE_OTHER): Payer: Self-pay

## 2023-07-14 ENCOUNTER — Other Ambulatory Visit (HOSPITAL_COMMUNITY): Payer: BC Managed Care – PPO

## 2023-07-20 HISTORY — PX: OTHER SURGICAL HISTORY: SHX169

## 2023-08-14 ENCOUNTER — Encounter (INDEPENDENT_AMBULATORY_CARE_PROVIDER_SITE_OTHER): Payer: Self-pay | Admitting: Gastroenterology

## 2023-08-14 ENCOUNTER — Ambulatory Visit (INDEPENDENT_AMBULATORY_CARE_PROVIDER_SITE_OTHER): Payer: BC Managed Care – PPO | Admitting: Gastroenterology

## 2023-08-14 VITALS — BP 116/70 | HR 70 | Temp 97.3°F | Ht 61.0 in | Wt 125.5 lb

## 2023-08-14 DIAGNOSIS — R140 Abdominal distension (gaseous): Secondary | ICD-10-CM | POA: Diagnosis not present

## 2023-08-14 DIAGNOSIS — G8929 Other chronic pain: Secondary | ICD-10-CM

## 2023-08-14 DIAGNOSIS — K581 Irritable bowel syndrome with constipation: Secondary | ICD-10-CM

## 2023-08-14 DIAGNOSIS — R1110 Vomiting, unspecified: Secondary | ICD-10-CM | POA: Insufficient documentation

## 2023-08-14 DIAGNOSIS — A048 Other specified bacterial intestinal infections: Secondary | ICD-10-CM | POA: Diagnosis not present
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
## 2023-08-14 NOTE — Patient Instructions (Signed)
Schedule gastric emptying study Can continue with Linzess as needed for constipation If negative GES testing, will start low dose anxiolytic (sertraline)

## 2023-08-22 ENCOUNTER — Ambulatory Visit: Payer: BC Managed Care – PPO | Admitting: Family Medicine

## 2023-08-22 ENCOUNTER — Encounter: Payer: Self-pay | Admitting: Family Medicine

## 2023-08-22 VITALS — BP 106/70 | HR 74 | Ht 61.0 in | Wt 124.0 lb

## 2023-08-22 DIAGNOSIS — G4701 Insomnia due to medical condition: Secondary | ICD-10-CM

## 2023-08-22 DIAGNOSIS — F419 Anxiety disorder, unspecified: Secondary | ICD-10-CM

## 2023-08-22 DIAGNOSIS — F0781 Postconcussional syndrome: Secondary | ICD-10-CM | POA: Diagnosis not present

## 2023-08-22 DIAGNOSIS — H9313 Tinnitus, bilateral: Secondary | ICD-10-CM

## 2023-08-22 DIAGNOSIS — G8929 Other chronic pain: Secondary | ICD-10-CM | POA: Diagnosis not present
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
## 2023-08-22 NOTE — Patient Instructions (Signed)
Below is our plan:  We will continue to monitor. Consider supplements below for headache management.   Please make sure you are staying well hydrated. I recommend 50-60 ounces daily. Well balanced diet and regular exercise encouraged. Consistent sleep schedule with 6-8 hours recommended.   Please continue follow up with care team as directed.   Follow up with me as needed   You may receive a survey regarding today's visit. I encourage you to leave honest feed back as I do use this information to improve patient care. Thank you for seeing me today!   GENERAL HEADACHE INFORMATION:   Natural supplements: Magnesium Oxide or Magnesium Glycinate 500 mg at bed (up to 800 mg daily) Coenzyme Q10 300 mg in AM Vitamin B2- 200 mg twice a day   Add 1 supplement at a time since even natural supplements can have undesirable side effects. You can sometimes buy supplements cheaper (especially Coenzyme Q10) at www.WebmailGuide.co.za or at [facility].  Migraine with aura: There is increased risk for stroke in women with migraine with aura and a contraindication for the combined contraceptive pill for use by women who have migraine with aura. The risk for women with migraine without aura is lower. However other risk factors like smoking are far more likely to increase stroke risk than migraine. There is a recommendation for no smoking and for the use of OCPs without estrogen such as progestogen only pills particularly for women with migraine with aura.Marland Kitchen People who have migraine headaches with auras may be 3 times more likely to have a stroke caused by a blood clot, compared to migraine patients who don't see auras. Women who take hormone-replacement therapy may be 30 percent more likely to suffer a clot-based stroke than women not taking medication containing estrogen. Other risk factors like smoking and high blood pressure may be  much more important.    Vitamins and herbs that show potential:   Magnesium: Magnesium (250  mg twice a day or 500 mg at bed) has a relaxant effect on smooth muscles such as blood vessels. Individuals suffering from frequent or daily headache usually have low magnesium levels which can be increase with daily supplementation of 400-750 mg. Three trials found 40-90% average headache reduction  when used as a preventative. Magnesium may help with headaches are aura, the best evidence for magnesium is for migraine with aura is its thought to stop the cortical spreading depression we believe is the pathophysiology of migraine aura.Magnesium also demonstrated the benefit in menstrually related migraine.  Magnesium is part of the messenger system in the serotonin cascade and it is a good muscle relaxant.  It is also useful for constipation which can be a side effect of other medications used to treat migraine. Good sources include nuts, whole grains, and tomatoes. Side Effects: loose stool/diarrhea  Riboflavin (vitamin B 2) 200 mg twice a day. This vitamin assists nerve cells in the production of ATP a principal energy storing molecule.  It is necessary for many chemical reactions in the body.  There have been at least 3 clinical trials of riboflavin using 400 mg per day all of which suggested that migraine frequency can be decreased.  All 3 trials showed significant improvement in over half of migraine sufferers.  The supplement is found in bread, cereal, milk, meat, and poultry.  Most Americans get more riboflavin than the recommended daily allowance, however riboflavin deficiency is not necessary for the supplements to help prevent headache. Side effects: energizing, green urine  Coenzyme Q10: This is present in almost all cells in the body and is critical component for the conversion of energy.  Recent studies have shown that a nutritional supplement of CoQ10 can reduce the frequency of migraine attacks by improving the energy production of cells as with riboflavin.  Doses of 150 mg twice a day have been  shown to be effective.   Melatonin: Increasing evidence shows correlation between melatonin secretion and headache conditions.  Melatonin supplementation has decreased headache intensity and duration.  It is widely used as a sleep aid.  Sleep is natures way of dealing with migraine.  A dose of 3 mg is recommended to start for headaches including cluster headache. Higher doses up to 15 mg has been reviewed for use in Cluster headache and have been used. The rationale behind using melatonin for cluster is that many theories regarding the cause of Cluster headache center around the disruption of the normal circadian rhythm in the brain.  This helps restore the normal circadian rhythm.   HEADACHE DIET: Foods and beverages which may trigger migraine Note that only 20% of headache patients are food sensitive. You will know if you are food sensitive if you get a headache consistently 20 minutes to 2 hours after eating a certain food. Only cut out a food if it causes headaches, otherwise you might remove foods you enjoy! What matters most for diet is to eat a well balanced healthy diet full of vegetables and low fat protein, and to not miss meals.   Chocolate, other sweets ALL cheeses except cottage and cream cheese Dairy products, yogurt, sour cream, ice cream Liver Meat extracts (Bovril, Marmite, meat tenderizers) Meats or fish which have undergone aging, fermenting, pickling or smoking. These include: Hotdogs,salami,Lox,sausage, mortadellas,smoked salmon, pepperoni, Pickled herring Pods of broad bean (English beans, Chinese pea pods, [place] (fava) beans, lima and navy beans Ripe avocado, ripe banana Yeast extracts or active yeast preparations such as Brewer's or Fleishman's (commercial bakes goods are permitted) Tomato based foods, pizza (lasagna, etc.)   MSG (monosodium glutamate) is disguised as many things; look for these common aliases: Monopotassium glutamate Autolysed yeast Hydrolysed  protein Sodium caseinate "flavorings" "all natural preservatives" Nutrasweet   Avoid all other foods that convincingly provoke headaches.   Resources: The Dizzy [place] Laundry Your Headache Diet, migrainestrong.com  https://zamora-andrews.com/   Caffeine and Migraine For patients that have migraine, caffeine intake more than 3 days per week can lead to dependency and increased migraine frequency. I would recommend cutting back on your caffeine intake as best you can. The recommended amount of caffeine is 200-300 mg daily, although migraine patients may experience dependency at even lower doses. While you may notice an increase in headache temporarily, cutting back will be helpful for headaches in the long run. For more information on caffeine and migraine, visit: https://americanmigrainefoundation.org/resource-library/caffeine-and-migraine/   Headache Prevention Strategies:   1. Maintain a headache diary; learn to identify and avoid triggers.  - This can be a simple note where you log when you had a headache, associated symptoms, and medications used - There are several smartphone apps developed to help track migraines: Migraine Buddy, Migraine Monitor, Curelator N1-Headache App   Common triggers include: Emotional triggers: Emotional/Upset family or friends Emotional/Upset occupation Business reversal/success Anticipation anxiety Crisis-serious Post-crisis periodNew job/position   Physical triggers: Vacation Day Weekend Strenuous Exercise High Altitude Location New Move Menstrual Day Physical Illness Oversleep/Not enough sleep Weather changes Light: Photophobia or light sesnitivity treatment involves a balance between desensitization and reduction  in overly strong input. Use dark polarized glasses outside, but not inside. Avoid bright or fluorescent light, but do not dim environment to the point that going into a normally lit room  hurts. Consider FL-41 tint lenses, which reduce the most irritating wavelengths without blocking too much light.  These can be obtained at axonoptics.com or theraspecs.com Foods: see list above.   2. Limit use of acute treatments (over-the-counter medications, triptans, etc.) to no more than 2 days per week or 10 days per month to prevent medication overuse headache (rebound headache).     3. Follow a regular schedule (including weekends and holidays): Don't skip meals. Eat a balanced diet. 8 hours of sleep nightly. Minimize stress. Exercise 30 minutes per day. Being overweight is associated with a 5 times increased risk of chronic migraine. Keep well hydrated and drink 6-8 glasses of water per day.   4. Initiate non-pharmacologic measures at the earliest onset of your headache. Rest and quiet environment. Relax and reduce stress. Breathe2Relax is a free app that can instruct you on    some simple relaxtion and breathing techniques. Http://Dawnbuse.com is a    free website that provides teaching videos on relaxation.  Also, there are  many apps that   can be downloaded for "mindful" relaxation.  An app called YOGA NIDRA will help walk you through mindfulness. Another app called Calm can be downloaded to give you a structured mindfulness guide with daily reminders and skill development. Headspace for guided meditation Mindfulness Based Stress Reduction Online Course: www.palousemindfulness.com Cold compresses.   5. Don't wait!! Take the maximum allowable dosage of prescribed medication at the first sign of migraine.   6. Compliance:  Take prescribed medication regularly as directed and at the first sign of a migraine.   7. Communicate:  Call your physician when problems arise, especially if your headaches change, increase in frequency/severity, or become associated with neurological symptoms (weakness, numbness, slurred speech, etc.). Proceed to emergency room if you experience new or worsening  symptoms or symptoms do not resolve, if you have new neurologic symptoms or if headache is severe, or for any concerning symptom.   8. Headache/pain management therapies: Consider various complementary methods, including medication, behavioral therapy, psychological counselling, biofeedback, massage therapy, acupuncture, dry needling, and other modalities.  Such measures may reduce the need for medications. Counseling for pain management, where patients learn to function and ignore/minimize their pain, seems to work very well.   9. Recommend changing family's attention and focus away from patient's headaches. Instead, emphasize daily activities. If first question of day is 'How are your headaches/Do you have a headache today?', then patient will constantly think about headaches, thus making them worse. Goal is to re-direct attention away from headaches, toward daily activities and other distractions.   10. Helpful Websites: www.AmericanHeadacheSociety.org PatentHood.ch www.headaches.org TightMarket.nl www.achenet.org

## 2023-09-01 ENCOUNTER — Encounter: Payer: BC Managed Care – PPO | Admitting: Vascular Surgery

## 2023-09-01 ENCOUNTER — Other Ambulatory Visit (HOSPITAL_COMMUNITY): Payer: BC Managed Care – PPO

## 2023-09-02 ENCOUNTER — Other Ambulatory Visit: Payer: Self-pay

## 2023-09-02 ENCOUNTER — Emergency Department (HOSPITAL_COMMUNITY): Payer: BC Managed Care – PPO

## 2023-09-02 ENCOUNTER — Emergency Department (HOSPITAL_COMMUNITY)
Admission: EM | Admit: 2023-09-02 | Discharge: 2023-09-02 | Disposition: A | Discharge status: Home / Self Care | Payer: BC Managed Care – PPO | Attending: Emergency Medicine | Admitting: Emergency Medicine

## 2023-09-02 ENCOUNTER — Encounter (HOSPITAL_COMMUNITY): Payer: Self-pay | Admitting: Emergency Medicine

## 2023-09-02 DIAGNOSIS — R0789 Other chest pain: Secondary | ICD-10-CM | POA: Insufficient documentation

## 2023-09-02 DIAGNOSIS — R079 Chest pain, unspecified: Secondary | ICD-10-CM | POA: Diagnosis present

## 2023-09-02 LAB — BASIC METABOLIC PANEL
Anion gap: 5 (ref 5–15)
BUN: 14 mg/dL (ref 6–20)
CO2: 26 mmol/L (ref 22–32)
Calcium: 8.4 mg/dL — ABNORMAL LOW (ref 8.9–10.3)
Chloride: 107 mmol/L (ref 98–111)
Creatinine, Ser: 0.42 mg/dL — ABNORMAL LOW (ref 0.44–1.00)
GFR, Estimated: >60 mL/min (ref >60)
Glucose, Bld: 96 mg/dL (ref 70–99)
Potassium: 4 mmol/L (ref 3.5–5.1)
Sodium: 138 mmol/L (ref 135–145)

## 2023-09-02 LAB — CBC
HCT: 33.1 % — ABNORMAL LOW (ref 36.0–46.0)
Hemoglobin: 11.2 g/dL — ABNORMAL LOW (ref 12.0–15.0)
MCH: 31.1 pg (ref 26.0–34.0)
MCHC: 33.8 g/dL (ref 30.0–36.0)
MCV: 91.9 fL (ref 80.0–100.0)
Platelets: 241 10*3/uL (ref 150–400)
RBC: 3.6 MIL/uL — ABNORMAL LOW (ref 3.87–5.11)
RDW: 12.9 % (ref 11.5–15.5)
WBC: 3.6 10*3/uL — ABNORMAL LOW (ref 4.0–10.5)
nRBC: 0 % (ref 0.0–0.2)

## 2023-09-02 LAB — TROPONIN I (HIGH SENSITIVITY): Troponin I (High Sensitivity): <2 ng/L (ref <18)

## 2023-09-02 NOTE — Discharge Instructions (Signed)
Follow-up with your family doctor if any problems.  You also have been referred to vascular surgery again since your physician is attempting to make that referral

## 2023-09-02 NOTE — ED Notes (Signed)
Patient transported to CT 

## 2023-09-02 NOTE — ED Provider Notes (Signed)
[facility] AT [facility] Provider Note   CSN: [id number] Arrival date & time: 09/02/23  [id number]     History {Add pertinent medical, surgical, social history, OB history to HPI:1} Chief Complaint  Patient presents with   Chest Pain   Headache    Gail [name] is a 52 y.o. female.  Patient complains of headache palpitations and chest discomfort.  She states when she woke up this morning she was unable to move but now she is fine.  This has happened to her 3 times before.  Her physician is trying to get her into see a vascular surgeon.   Chest Pain Associated symptoms: headache   Headache      Home Medications Prior to Admission medications   Medication Sig Start Date End Date Taking? Authorizing Provider  acetaminophen (TYLENOL) 325 MG tablet Take 650 mg by mouth every 6 (six) hours as needed.    [provider]  BIOTIN PO Take 2,500 mcg by mouth every other day.    [provider]  Carboxymethylcellulose Sodium (THERATEARS) 0.25 % SOLN Place 1 drop into both eyes as needed.    [provider]  Cholecalciferol (VITAMIN D3) 50 MCG (2000 UT) capsule Take 2,000 Units by mouth daily.    [provider]  cyanocobalamin (,VITAMIN B-12,) 1000 MCG/ML injection Inject 1,000 mcg into the muscle every 30 (thirty) days. Now taking every 60 days. 04/09/13   [provider]      Allergies    Lactose intolerance (gi) and Other    Review of Systems   Review of Systems  Cardiovascular:  Positive for chest pain.  Neurological:  Positive for headaches.    Physical Exam Updated Vital Signs BP 99/67   Pulse 65   Temp 98.1 F (36.7 C) (Oral)   Resp 10   Ht 5\' 1"  (1.549 m)   Wt 56 kg   LMP 04/16/2023 (Approximate)   SpO2 98%   BMI 23.33 kg/m  Physical Exam  ED Results / Procedures / Treatments   Labs (all labs ordered are listed, but only abnormal results are displayed) Labs Reviewed  BASIC METABOLIC  PANEL - Abnormal; Notable for the following components:      Result Value   Creatinine, Ser 0.42 (*)    Calcium 8.4 (*)    All other components within normal limits  CBC - Abnormal; Notable for the following components:   WBC 3.6 (*)    RBC 3.60 (*)    Hemoglobin 11.2 (*)    HCT 33.1 (*)    All other components within normal limits  POC URINE PREG, ED  TROPONIN I (HIGH SENSITIVITY)    EKG EKG Interpretation Date/Time:  Saturday September 02 2023 06:16:34 EDT Ventricular Rate:  77 PR Interval:  161 QRS Duration:  89 QT Interval:  392 QTC Calculation: 444 R Axis:   74  Text Interpretation: Sinus rhythm Confirmed by Bethann Berkshire [phone]) on 09/02/2023 9:08:26 AM  Radiology CT Head Wo Contrast  Result Date: 09/02/2023 CLINICAL DATA:  Chronic neck pain. TIA, episode of inability to move or speak EXAM: CT HEAD WITHOUT CONTRAST CT CERVICAL SPINE WITHOUT CONTRAST TECHNIQUE: Multidetector CT imaging of the head and cervical spine was performed following the standard protocol without intravenous contrast. Multiplanar CT image reconstructions of the cervical spine were also generated. RADIATION DOSE REDUCTION: This exam was performed according to the departmental dose-optimization program which includes automated exposure control, adjustment of the mA and/or kV according  to patient size and/or use of iterative reconstruction technique. COMPARISON:  03/09/2022 FINDINGS: CT HEAD FINDINGS Brain: No evidence of acute infarction, hemorrhage, hydrocephalus, extra-axial collection or mass lesion/mass effect. Vascular: No hyperdense vessel or unexpected calcification. Skull: Normal. Negative for fracture or focal lesion. Sinuses/Orbits: No acute finding. CT CERVICAL SPINE FINDINGS Alignment: Normal. Skull base and vertebrae: No acute fracture. No primary bone lesion or focal pathologic process. Soft tissues and spinal canal: No prevertebral fluid or swelling. No visible canal hematoma. Disc levels:  No  significant degenerative change Upper chest: Negative. Other: Sequela of remote bilateral parotiditis, likely from patient's history of connective tissue disease. IMPRESSION: Unremarkable CT of the head and cervical spine. Electronically Signed   By: Tiburcio Pea M.D.   On: 09/02/2023 07:45   CT Cervical Spine Wo Contrast  Result Date: 09/02/2023 CLINICAL DATA:  Chronic neck pain. TIA, episode of inability to move or speak EXAM: CT HEAD WITHOUT CONTRAST CT CERVICAL SPINE WITHOUT CONTRAST TECHNIQUE: Multidetector CT imaging of the head and cervical spine was performed following the standard protocol without intravenous contrast. Multiplanar CT image reconstructions of the cervical spine were also generated. RADIATION DOSE REDUCTION: This exam was performed according to the departmental dose-optimization program which includes automated exposure control, adjustment of the mA and/or kV according to patient size and/or use of iterative reconstruction technique. COMPARISON:  03/09/2022 FINDINGS: CT HEAD FINDINGS Brain: No evidence of acute infarction, hemorrhage, hydrocephalus, extra-axial collection or mass lesion/mass effect. Vascular: No hyperdense vessel or unexpected calcification. Skull: Normal. Negative for fracture or focal lesion. Sinuses/Orbits: No acute finding. CT CERVICAL SPINE FINDINGS Alignment: Normal. Skull base and vertebrae: No acute fracture. No primary bone lesion or focal pathologic process. Soft tissues and spinal canal: No prevertebral fluid or swelling. No visible canal hematoma. Disc levels:  No significant degenerative change Upper chest: Negative. Other: Sequela of remote bilateral parotiditis, likely from patient's history of connective tissue disease. IMPRESSION: Unremarkable CT of the head and cervical spine. Electronically Signed   By: Tiburcio Pea M.D.   On: 09/02/2023 07:45   DG Chest Portable 1 View  Result Date: 09/02/2023 CLINICAL DATA:  Chest pain and headache EXAM:  PORTABLE CHEST 1 VIEW COMPARISON:  10/08/2022 FINDINGS: Artifact from EKG leads. Normal heart size and mediastinal contours. No acute infiltrate or edema. No effusion or pneumothorax. No acute osseous findings. IMPRESSION: No active disease. Electronically Signed   By: Tiburcio Pea M.D.   On: 09/02/2023 06:34    Procedures Procedures  {Document cardiac monitor, telemetry assessment procedure when appropriate:1}  Medications Ordered in ED Medications - No data to display  ED Course/ Medical Decision Making/ A&P   {   Click here for ABCD2, HEART and other calculatorsREFRESH Note before signing :1}                              Medical Decision Making Amount and/or Complexity of Data Reviewed Labs: ordered. Radiology: ordered.   Labs and EKG chest x-ray were unremarkable.  Patient with atypical chest discomfort and weakness.  Patient is referred back to her family doctor and also referred to vascular surgery because her family doctor is sending her to vascular surgery but the referral has not been put in yet  {Document critical care time when appropriate:1} {Document review of labs and clinical decision tools ie heart score, Chads2Vasc2 etc:1}  {Document your independent review of radiology images, and any outside records:1} {Document your discussion with  family members, caretakers, and with consultants:1} {Document social determinants of health affecting pt's care:1} {Document your decision making why or why not admission, treatments were needed:1} Final Clinical Impression(s) / ED Diagnoses Final diagnoses:  Atypical chest pain    Rx / DC Orders ED Discharge Orders     None

## 2023-09-02 NOTE — ED Triage Notes (Signed)
Pt bib EMs with c/o headache, palpitations, and chest pain that radiates to L arm. Pt states that this all happened when she woke up around 5am and that she "couldn't move or speak". Pt states she took 4 ASA but unsure if it was "regular or baby" ASA. Pt states same thing happened a couple of weeks ago but that she did not seek care anywhere.

## 2023-09-05 ENCOUNTER — Other Ambulatory Visit: Payer: Self-pay | Admitting: *Deleted

## 2023-09-05 DIAGNOSIS — M79601 Pain in right arm: Secondary | ICD-10-CM

## 2023-09-06 ENCOUNTER — Encounter: Payer: Self-pay | Admitting: Vascular Surgery

## 2023-09-06 ENCOUNTER — Ambulatory Visit (HOSPITAL_COMMUNITY)
Admission: RE | Admit: 2023-09-06 | Discharge: 2023-09-06 | Disposition: A | Discharge status: Home / Self Care | Payer: BC Managed Care – PPO | Source: Ambulatory Visit | Attending: Vascular Surgery | Admitting: Vascular Surgery

## 2023-09-06 ENCOUNTER — Ambulatory Visit: Payer: BC Managed Care – PPO | Admitting: Vascular Surgery

## 2023-09-06 VITALS — BP 106/72 | HR 69 | Temp 98.6°F | Resp 20 | Ht 61.0 in | Wt 125.0 lb

## 2023-09-06 DIAGNOSIS — M79601 Pain in right arm: Secondary | ICD-10-CM | POA: Diagnosis not present

## 2023-09-06 DIAGNOSIS — G540 Brachial plexus disorders: Secondary | ICD-10-CM | POA: Diagnosis not present
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
## 2023-09-25 ENCOUNTER — Ambulatory Visit (HOSPITAL_COMMUNITY): Payer: BC Managed Care – PPO | Attending: Vascular Surgery

## 2023-09-25 DIAGNOSIS — M5412 Radiculopathy, cervical region: Secondary | ICD-10-CM | POA: Insufficient documentation

## 2023-09-25 DIAGNOSIS — M6281 Muscle weakness (generalized): Secondary | ICD-10-CM | POA: Insufficient documentation

## 2023-09-25 DIAGNOSIS — R293 Abnormal posture: Secondary | ICD-10-CM | POA: Diagnosis present

## 2023-09-25 DIAGNOSIS — M79602 Pain in left arm: Secondary | ICD-10-CM | POA: Insufficient documentation
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
## 2023-10-04 ENCOUNTER — Encounter (HOSPITAL_COMMUNITY): Payer: BC Managed Care – PPO

## 2023-10-06 ENCOUNTER — Ambulatory Visit (HOSPITAL_COMMUNITY): Payer: BC Managed Care – PPO | Attending: Vascular Surgery

## 2023-10-06 DIAGNOSIS — M79602 Pain in left arm: Secondary | ICD-10-CM | POA: Insufficient documentation

## 2023-10-06 DIAGNOSIS — M6281 Muscle weakness (generalized): Secondary | ICD-10-CM | POA: Insufficient documentation

## 2023-10-06 DIAGNOSIS — R293 Abnormal posture: Secondary | ICD-10-CM | POA: Insufficient documentation

## 2023-10-06 DIAGNOSIS — M5412 Radiculopathy, cervical region: Secondary | ICD-10-CM | POA: Insufficient documentation
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
## 2023-10-10 ENCOUNTER — Ambulatory Visit (HOSPITAL_COMMUNITY): Payer: BC Managed Care – PPO

## 2023-10-10 DIAGNOSIS — M5412 Radiculopathy, cervical region: Secondary | ICD-10-CM | POA: Diagnosis not present

## 2023-10-10 DIAGNOSIS — M6281 Muscle weakness (generalized): Secondary | ICD-10-CM

## 2023-10-10 DIAGNOSIS — R293 Abnormal posture: Secondary | ICD-10-CM

## 2023-10-10 DIAGNOSIS — M79602 Pain in left arm: Secondary | ICD-10-CM
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
## 2023-10-16 ENCOUNTER — Encounter: Payer: BC Managed Care – PPO | Admitting: Surgery

## 2023-10-16 ENCOUNTER — Other Ambulatory Visit (HOSPITAL_COMMUNITY): Payer: BC Managed Care – PPO

## 2023-10-17 ENCOUNTER — Ambulatory Visit (HOSPITAL_COMMUNITY): Payer: BC Managed Care – PPO

## 2023-10-17 DIAGNOSIS — M6281 Muscle weakness (generalized): Secondary | ICD-10-CM

## 2023-10-17 DIAGNOSIS — M5412 Radiculopathy, cervical region: Secondary | ICD-10-CM

## 2023-10-17 DIAGNOSIS — M79602 Pain in left arm: Secondary | ICD-10-CM

## 2023-10-17 DIAGNOSIS — R293 Abnormal posture: Secondary | ICD-10-CM
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
## 2023-10-24 ENCOUNTER — Encounter (HOSPITAL_COMMUNITY): Payer: BC Managed Care – PPO

## 2023-10-25 ENCOUNTER — Encounter: Payer: Self-pay | Admitting: Vascular Surgery

## 2023-10-25 ENCOUNTER — Ambulatory Visit: Payer: BC Managed Care – PPO | Admitting: Vascular Surgery

## 2023-10-25 VITALS — BP 101/64 | HR 78 | Temp 98.2°F | Ht 61.0 in | Wt 125.1 lb

## 2023-10-25 DIAGNOSIS — G540 Brachial plexus disorders: Secondary | ICD-10-CM
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
## 2023-10-30 ENCOUNTER — Telehealth: Payer: Self-pay | Admitting: Cardiology

## 2023-10-30 NOTE — Telephone Encounter (Signed)
   Pt c/o of Chest Pain: STAT if active CP, including tightness, pressure, jaw pain, radiating pain to shoulder/upper arm/back, CP unrelieved by Nitro. Symptoms reported of SOB, nausea, vomiting, sweating.  1. Are you having CP right now? no    2. Are you experiencing any other symptoms (ex. SOB, nausea, vomiting, sweating)? Arm goes numb and left finger gets locked, tiredness. Patient went to the hospital in October for palpitations and chest pain. Patient states that it is not getting any better.    3. Is your CP continuous or coming and going? Coming and going, comes in the mornings typically.   4. Have you taken Nitroglycerin? No, she states that she takes a baby aspirin and it does seem to help.    5. How long have you been experiencing CP? She states that it has been happening for a while, since last April.     6. If NO CP at time of call then end call with telling Pt to call back or call 911 if Chest pain returns prior to return call from triage team.   Pt scheduled with Azalee Course, PA on 12/06 at 8:50am but patient wants to be seen sooner.

## 2023-10-30 NOTE — Telephone Encounter (Signed)
Spoke to patient she stated she has been having chest pain and palpitations off and on since Oct.No chest pain at present. Appointment scheduled with Joni Reining DNP 12/3 at 10:30 am.
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
## 2023-10-31 ENCOUNTER — Ambulatory Visit: Payer: BC Managed Care – PPO | Attending: Adult Health | Admitting: Adult Health

## 2023-10-31 ENCOUNTER — Ambulatory Visit (INDEPENDENT_AMBULATORY_CARE_PROVIDER_SITE_OTHER): Payer: BC Managed Care – PPO

## 2023-10-31 ENCOUNTER — Encounter: Payer: Self-pay | Admitting: Adult Health

## 2023-10-31 VITALS — BP 102/60 | HR 67 | Ht 61.0 in | Wt 125.4 lb

## 2023-10-31 DIAGNOSIS — F410 Panic disorder [episodic paroxysmal anxiety] without agoraphobia: Secondary | ICD-10-CM

## 2023-10-31 DIAGNOSIS — R002 Palpitations: Secondary | ICD-10-CM | POA: Diagnosis not present

## 2023-10-31 DIAGNOSIS — R4589 Other symptoms and signs involving emotional state: Secondary | ICD-10-CM

## 2023-10-31 DIAGNOSIS — R0683 Snoring: Secondary | ICD-10-CM

## 2023-10-31 DIAGNOSIS — R400 Somnolence: Secondary | ICD-10-CM

## 2023-10-31 DIAGNOSIS — R079 Chest pain, unspecified: Secondary | ICD-10-CM

## 2023-10-31 NOTE — Progress Notes (Unsigned)
Enrolled patient for a 14 day Zio XT monitor to be mailed to patients home  [place] to read

## 2023-10-31 NOTE — Patient Instructions (Signed)
Medication Instructions:  No Changes *If you need a refill on your cardiac medications before your next appointment, please call your pharmacy*   Lab Work: No labs If you have labs (blood work) drawn today and your tests are completely normal, you will receive your results only by: MyChart Message (if you have MyChart) OR A paper copy in the mail If you have any lab test that is abnormal or we need to change your treatment, we will call you to review the results.   Testing/Procedures: Christena Deem- Long Term Monitor Instructions  Your physician has requested you wear a ZIO patch monitor for 14 days.  This is a single patch monitor. [employer] supplies one patch monitor per enrollment. Additional stickers are not available. Please do not apply patch if you will be having a Nuclear Stress Test,  Echocardiogram, Cardiac CT, MRI, or Chest Xray during the period you would be wearing the  monitor. The patch cannot be worn during these tests. You cannot remove and re-apply the  ZIO XT patch monitor.  Your ZIO patch monitor will be mailed 3 day USPS to your address on file. It may take 3-5 days  to receive your monitor after you have been enrolled.  Once you have received your monitor, please review the enclosed instructions. Your monitor  has already been registered assigning a specific monitor serial # to you.  Billing and Patient Assistance Program Information  We have supplied [employer] with any of your insurance information on file for billing purposes. [employer] offers a sliding scale Patient Assistance Program for patients that do not have  insurance, or whose insurance does not completely cover the cost of the ZIO monitor.  You must apply for the Patient Assistance Program to qualify for this discounted rate.  To apply, please call [employer] at [phone], select option 4, select option 2, ask to apply for  Patient Assistance Program. Meredeth Ide will ask your household income, and how many people   are in your household. They will quote your out-of-pocket cost based on that information.  [employer] will also be able to set up a 19-month, interest-free payment plan if needed.  Applying the monitor   Shave hair from upper left chest.  Hold abrader disc by orange tab. Rub abrader in 40 strokes over the upper left chest as  indicated in your monitor instructions.  Clean area with 4 enclosed alcohol pads. Let dry.  Apply patch as indicated in monitor instructions. Patch will be placed under collarbone on left  side of chest with arrow pointing upward.  Rub patch adhesive wings for 2 minutes. Remove white label marked "1". Remove the white  label marked "2". Rub patch adhesive wings for 2 additional minutes.  While looking in a mirror, press and release button in center of patch. A small green light will  flash 3-4 times. This will be your only indicator that the monitor has been turned on.  Do not shower for the first 24 hours. You may shower after the first 24 hours.  Press the button if you feel a symptom. You will hear a small click. Record Date, Time and  Symptom in the Patient Logbook.  When you are ready to remove the patch, follow instructions on the last 2 pages of Patient  Logbook. Stick patch monitor onto the last page of Patient Logbook.  Place Patient Logbook in the blue and white box. Use locking tab on box and tape box closed  securely. The blue and white box  has prepaid postage on it. Please place it in the mailbox as  soon as possible. Your physician should have your test results approximately 7 days after the  monitor has been mailed back to [facility].  Call [facility] Customer Care at [phone] if you have questions regarding  your ZIO XT patch monitor. Call them immediately if you see an orange light blinking on your  monitor.  If your monitor falls off in less than 4 days, contact our Monitor department at [phone].  If your monitor becomes loose or  falls off after 4 days call [employer] at [phone] for  suggestions on securing your monitor   [facility] Your physician has recommended that you have a sleep study. This test records several body functions during sleep, including: brain activity, eye movement, oxygen and carbon dioxide blood levels, heart rate and rhythm, breathing rate and rhythm, the flow of air through your mouth and nose, snoring, body muscle movements, and chest and belly movement.   Follow-Up: At [facility], you and your health needs are our priority.  As part of our continuing mission to provide you with exceptional heart care, we have created designated Provider Care Teams.  These Care Teams include your primary Cardiologist (physician) and Advanced Practice Providers (APPs -  Physician Assistants and Nurse Practitioners) who all work together to provide you with the care you need, when you need it.  We recommend signing up for the patient portal called "MyChart".  Sign up information is provided on this After Visit Summary.  MyChart is used to connect with patients for Virtual Visits (Telemedicine).  Patients are able to view lab/test results, encounter notes, upcoming appointments, etc.  Non-urgent messages can be sent to your provider as well.   To learn more about what you can do with MyChart, go to [email or web address].    Your next appointment:   6 week(s)  Provider:   Joni Reining, DNP, ANP   or, Olga Millers, MD

## 2023-11-02 ENCOUNTER — Ambulatory Visit (INDEPENDENT_AMBULATORY_CARE_PROVIDER_SITE_OTHER): Payer: BC Managed Care – PPO | Admitting: Gastroenterology

## 2023-11-03 ENCOUNTER — Ambulatory Visit: Payer: BC Managed Care – PPO | Admitting: Physician Assistant

## 2023-11-05 DIAGNOSIS — R002 Palpitations: Secondary | ICD-10-CM

## 2023-11-13 ENCOUNTER — Telehealth: Payer: Self-pay | Admitting: Cardiology

## 2023-11-13 NOTE — Telephone Encounter (Signed)
Patient calling in about at home sleep test. Please advise

## 2023-11-14 NOTE — Telephone Encounter (Signed)
Home Sleep test does not need prior authorization. Request sent to [facility] for scheduling.

## 2023-11-15 ENCOUNTER — Ambulatory Visit: Payer: BC Managed Care – PPO | Admitting: Vascular Surgery

## 2023-12-07 ENCOUNTER — Encounter: Payer: Self-pay | Admitting: Student in an Organized Health Care Education/Training Program

## 2023-12-07 ENCOUNTER — Ambulatory Visit
Payer: 59 | Attending: Student in an Organized Health Care Education/Training Program | Admitting: Student in an Organized Health Care Education/Training Program

## 2023-12-07 VITALS — BP 113/77 | HR 78 | Temp 98.2°F | Resp 16 | Ht 61.0 in | Wt 123.0 lb

## 2023-12-07 DIAGNOSIS — M542 Cervicalgia: Secondary | ICD-10-CM | POA: Insufficient documentation

## 2023-12-07 DIAGNOSIS — M5412 Radiculopathy, cervical region: Secondary | ICD-10-CM | POA: Insufficient documentation

## 2023-12-07 DIAGNOSIS — G540 Brachial plexus disorders: Secondary | ICD-10-CM | POA: Insufficient documentation

## 2023-12-07 DIAGNOSIS — M79602 Pain in left arm: Secondary | ICD-10-CM | POA: Diagnosis present
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
## 2023-12-07 NOTE — Progress Notes (Signed)
 Safety precautions to be maintained throughout the outpatient stay will include: orient to surroundings, keep bed in low position, maintain call bell within reach at all times, provide assistance with transfer out of bed and ambulation.

## 2023-12-13 ENCOUNTER — Telehealth: Payer: Self-pay

## 2023-12-13 ENCOUNTER — Ambulatory Visit
Admission: RE | Admit: 2023-12-13 | Discharge: 2023-12-13 | Disposition: A | Discharge status: Home / Self Care | Payer: 59 | Source: Ambulatory Visit | Attending: Student in an Organized Health Care Education/Training Program | Admitting: Student in an Organized Health Care Education/Training Program

## 2023-12-13 ENCOUNTER — Ambulatory Visit: Payer: BC Managed Care – PPO | Admitting: Vascular Surgery

## 2023-12-13 DIAGNOSIS — M79602 Pain in left arm: Secondary | ICD-10-CM

## 2023-12-13 DIAGNOSIS — G540 Brachial plexus disorders: Secondary | ICD-10-CM

## 2023-12-13 DIAGNOSIS — M5412 Radiculopathy, cervical region: Secondary | ICD-10-CM | POA: Diagnosis present

## 2023-12-13 DIAGNOSIS — M542 Cervicalgia: Secondary | ICD-10-CM | POA: Diagnosis present

## 2023-12-13 NOTE — Telephone Encounter (Addendum)
 Called patient regarding results. Patient had understanding of results.----- Message from Friddie Jetty sent at 12/12/2023  8:41 AM EST ----- I reviewed the report of her ZIO monitor which was read by Dr. Audery Blazing.  This revealed sinus bradycardia, predominantly normal sinus rhythm with an average heart rate of between 74 and 77 bpm, occasional elevated heart rate of 250 bpm that was extremely rare, and had occasional PACs and PVCs.  No planned changes in her medication at this time.  Will discuss on follow-up

## 2023-12-21 ENCOUNTER — Telehealth: Payer: Self-pay | Admitting: *Deleted

## 2023-12-21 NOTE — Telephone Encounter (Signed)
RI results read to patient. She will schedule eval appt to discuss results and plan.

## 2023-12-22 ENCOUNTER — Encounter (HOSPITAL_BASED_OUTPATIENT_CLINIC_OR_DEPARTMENT_OTHER): Payer: Self-pay

## 2023-12-22 ENCOUNTER — Ambulatory Visit: Payer: 59 | Admitting: Adult Health

## 2023-12-22 ENCOUNTER — Ambulatory Visit (HOSPITAL_BASED_OUTPATIENT_CLINIC_OR_DEPARTMENT_OTHER): Payer: 59 | Admitting: Cardiology

## 2023-12-29 ENCOUNTER — Ambulatory Visit (HOSPITAL_BASED_OUTPATIENT_CLINIC_OR_DEPARTMENT_OTHER): Payer: 59 | Attending: Adult Health | Admitting: Cardiology

## 2023-12-29 DIAGNOSIS — R0683 Snoring: Secondary | ICD-10-CM | POA: Insufficient documentation

## 2024-01-09 ENCOUNTER — Ambulatory Visit
Payer: 59 | Attending: Student in an Organized Health Care Education/Training Program | Admitting: Student in an Organized Health Care Education/Training Program

## 2024-01-09 ENCOUNTER — Encounter: Payer: Self-pay | Admitting: Student in an Organized Health Care Education/Training Program

## 2024-01-09 VITALS — BP 100/58 | HR 75 | Temp 98.1°F | Ht 61.0 in | Wt 119.0 lb

## 2024-01-09 DIAGNOSIS — G540 Brachial plexus disorders: Secondary | ICD-10-CM | POA: Insufficient documentation

## 2024-01-09 DIAGNOSIS — M542 Cervicalgia: Secondary | ICD-10-CM | POA: Insufficient documentation

## 2024-01-09 DIAGNOSIS — M79602 Pain in left arm: Secondary | ICD-10-CM | POA: Insufficient documentation

## 2024-01-09 NOTE — Progress Notes (Signed)
Safety precautions to be maintained throughout the outpatient stay will include: orient to surroundings, keep bed in low position, maintain call bell within reach at all times, provide assistance with transfer out of bed and ambulation.

## 2024-01-09 NOTE — Patient Instructions (Signed)
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
## 2024-01-10 ENCOUNTER — Telehealth: Payer: Self-pay

## 2024-01-10 NOTE — Telephone Encounter (Signed)
Notified patient of sleep study results and recommendations. All questions were answered and patient verbalized understanding. Sleep study results uploaded to Mychart, per patient request.

## 2024-01-10 NOTE — Procedures (Signed)
   Patient Name: Gail [name], Gail [name] Date: 12/30/2023 Gender: Female D.O.B: 1971-11-01 Age (years): 52 Referring Provider: Joni Reining NP Height (inches): 61 Interpreting Physician: Armanda Magic MD, ABSM Weight (lbs): 125 RPSGT: Elaina Pattee BMI: 24 MRN: [id number] Neck Size: 13.50  CLINICAL INFORMATION Sleep Study Type: HST  Indication for sleep study: N/A  Epworth Sleepiness Score: 3  SLEEP STUDY TECHNIQUE A multi-channel overnight portable sleep study was performed. The channels recorded were: nasal airflow, thoracic respiratory movement, and oxygen saturation with a pulse oximetry. Snoring was also monitored.  MEDICATIONS Patient self administered medications include: N/A.  SLEEP ARCHITECTURE Patient was studied for 356 minutes. The sleep efficiency was 100.0 % and the patient was supine for 0%. The arousal index was 0.0 per hour.  RESPIRATORY PARAMETERS The overall AHI was 1.7 per hour, with a central apnea index of 0 per hour.  The oxygen nadir was 92% during sleep.  CARDIAC DATA Mean heart rate during sleep was 68.5 bpm.  IMPRESSIONS - No significant obstructive sleep apnea occurred during this study (AHI = 1.7/h). - The patient had minimal or no oxygen desaturation during the study (Min O2 = 92%) - No snoring was audible during this study.  DIAGNOSIS - Normal study  RECOMMENDATIONS - Avoid alcohol, sedatives and other CNS depressants that may worsen sleep apnea and disrupt normal sleep architecture. - Sleep hygiene should be reviewed to assess factors that may improve sleep quality. - Weight management and regular exercise should be initiated or continued.  [Electronically signed] 01/10/2024 10:03 AM  Armanda Magic MD, ABSM Diplomate, American Board of Sleep Medicine

## 2024-01-10 NOTE — Telephone Encounter (Signed)
-----   Message from Armanda Magic sent at 01/10/2024 10:04 AM EST ----- Please let patient know that sleep study showed no significant sleep apnea.

## 2024-01-17 ENCOUNTER — Ambulatory Visit: Payer: BC Managed Care – PPO | Admitting: Vascular Surgery

## 2024-01-26 ENCOUNTER — Ambulatory Visit: Payer: 59 | Admitting: Adult Health

## 2024-02-07 ENCOUNTER — Ambulatory Visit: Payer: 59 | Admitting: Student in an Organized Health Care Education/Training Program

## 2024-02-13 ENCOUNTER — Telehealth: Payer: Self-pay | Admitting: Student in an Organized Health Care Education/Training Program

## 2024-02-13 NOTE — Telephone Encounter (Signed)
 Patient had may questions about scaline block. All questions answered.

## 2024-02-13 NOTE — Telephone Encounter (Signed)
 Patient has questions about tomorrows injections and would like to speak with a nurse today please

## 2024-02-13 NOTE — Telephone Encounter (Signed)
 PT called stated that she will like for a nurse to give her a call. She has appt schedule for tomorrow. TY

## 2024-02-14 ENCOUNTER — Ambulatory Visit
Payer: 59 | Attending: Student in an Organized Health Care Education/Training Program | Admitting: Student in an Organized Health Care Education/Training Program

## 2024-02-14 ENCOUNTER — Telehealth: Payer: Self-pay

## 2024-02-14 DIAGNOSIS — G540 Brachial plexus disorders: Secondary | ICD-10-CM | POA: Insufficient documentation

## 2024-02-14 DIAGNOSIS — M79602 Pain in left arm: Secondary | ICD-10-CM | POA: Diagnosis present

## 2024-02-14 MED ORDER — LIDOCAINE HCL (PF) 2 % IJ SOLN
INTRAMUSCULAR | Status: AC
  Filled 2024-02-14: qty 10

## 2024-02-14 MED ORDER — LACTATED RINGERS IV SOLN: Freq: Once | INTRAVENOUS | Status: AC

## 2024-02-14 MED ORDER — ROPIVACAINE HCL 2 MG/ML IJ SOLN
INTRAMUSCULAR | Status: AC
  Filled 2024-02-14: qty 20

## 2024-02-14 MED ORDER — MIDAZOLAM HCL 2 MG/2ML IJ SOLN
0.5000 mg | Freq: Once | INTRAMUSCULAR | Status: AC
  Administered 2024-02-14: 2 mg via INTRAVENOUS

## 2024-02-14 MED ORDER — LIDOCAINE HCL 2 % IJ SOLN
20.0000 mL | Freq: Once | INTRAMUSCULAR | Status: AC
  Administered 2024-02-14: 200 mg

## 2024-02-14 MED ORDER — DEXAMETHASONE SODIUM PHOSPHATE 10 MG/ML IJ SOLN
INTRAMUSCULAR | Status: AC
  Filled 2024-02-14: qty 1

## 2024-02-14 MED ORDER — MIDAZOLAM HCL 2 MG/2ML IJ SOLN
INTRAMUSCULAR | Status: AC
  Filled 2024-02-14: qty 2

## 2024-02-14 MED ORDER — ROPIVACAINE HCL 2 MG/ML IJ SOLN
20.0000 mL | Freq: Once | INTRAMUSCULAR | Status: AC
  Administered 2024-02-14: 20 mL via PERINEURAL

## 2024-02-14 NOTE — Telephone Encounter (Signed)
 Call to patient she states she is doing well. States she still has numbness and heaviness in the arms and ROM is about the same as it was earlier. Some tingling in the fingers of the left hand. Swallowing is a bit better. She is still doing ice 15 mins on and 15 mins off. Overall doing well.

## 2024-02-14 NOTE — Patient Instructions (Signed)

## 2024-02-14 NOTE — Progress Notes (Signed)
 PROVIDER NOTE: Interpretation of information contained herein should be left to medically-trained personnel. Specific patient instructions are provided elsewhere under "Patient Instructions" section of medical record. This document was created in part using STT-dictation technology, any transcriptional errors that may result from this process are unintentional.  Patient: Gail [name] Type: Established DOB: [birth year] MRN: [id number] PCP: Ardyth Man, MD  Service: Procedure DOS: 02/14/2024 Setting: Ambulatory Location: Ambulatory outpatient facility Delivery: Face-to-face Provider: Edward Jolly, MD Specialty: Interventional Pain Management Specialty designation: 09 Location: Outpatient facility Ref. Prov.: Edward Jolly, MD       Interventional Therapy   Primary Reason for Visit: Interventional Pain Management Treatment. CC: Neck Pain    Procedure:          Anesthesia, Analgesia, Anxiolysis:  Left interscalene brachial plexus nerve block for thoracic outlet syndrome  Anesthesia: Local (1-2% Lidocaine)  Anxiolysis: None  Sedation: Minimal IV Versed Guidance: Fluoroscopy           Position: Supine   1. 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 Left arm pain    NAS-11 Pain score:   Pre-procedure: 6 /10   Post-procedure: 0-No pain/10      H&P (Pre-op Assessment):  Gail [name] is a 53 y.o. (year old), female patient, seen today for interventional treatment. She  has a past surgical history that includes Appendectomy (1988); Cholecystectomy; Colonoscopy; Upper gastrointestinal endoscopy; Colonoscopy with propofol (N/A, 08/26/2020); polypectomy (08/26/2020); Esophagogastroduodenoscopy (egd) with propofol (N/A, 11/18/2022); and biopsy (11/18/2022). Gail [name] has a current medication list which includes the following prescription(s): acetaminophen, biotin, theratears, cyanocobalamin, vitamin d3, coenzyme q10, and sennosides. Her primarily concern today is the  Neck Pain  Initial Vital Signs:  Pulse/HCG Rate: 65ECG Heart Rate: 64 Temp: (!) 97.2 F (36.2 C) Resp: 16 BP: 103/64 SpO2: 100 %  BMI: Estimated body mass index is 21.97 kg/m as calculated from the following:   Height as of this encounter: 5\' 4"  (1.626 m).   Weight as of this encounter: 128 lb (58.1 kg).  Risk Assessment: Allergies: Reviewed. She is allergic to lactose intolerance (gi) and other.  Allergy Precautions: None required Coagulopathies: Reviewed. None identified.  Blood-thinner therapy: None at this time Active Infection(s): Reviewed. None identified. Gail [name] is afebrile  Site Confirmation: Gail [name] was asked to confirm the procedure and laterality before marking the site Procedure checklist: Completed Consent: Before the procedure and under the influence of no sedative(s), amnesic(s), or anxiolytics, the patient was informed of the treatment options, risks and possible complications. To fulfill our ethical and legal obligations, as recommended by the American Medical Association's Code of Ethics, I have informed the patient of my clinical impression; the nature and purpose of the treatment or procedure; the risks, benefits, and possible complications of the intervention; the alternatives, including doing nothing; the risk(s) and benefit(s) of the alternative treatment(s) or procedure(s); and the risk(s) and benefit(s) of doing nothing. The patient was provided information about the general risks and possible complications associated with the procedure. These may include, but are not limited to: failure to achieve desired goals, infection, bleeding, organ or nerve damage, allergic reactions, paralysis, and death. In addition, the patient was informed of those risks and complications associated to Spine-related procedures, such as failure to decrease pain; infection (i.e.: Meningitis, epidural or intraspinal abscess); bleeding (i.e.: epidural hematoma, subarachnoid  hemorrhage, or any other type of intraspinal or peri-dural bleeding); organ or nerve damage (i.e.: Any type of peripheral nerve, nerve root, or spinal cord  injury) with subsequent damage to sensory, motor, and/or autonomic systems, resulting in permanent pain, numbness, and/or weakness of one or several areas of the body; allergic reactions; (i.e.: anaphylactic reaction); and/or death. Furthermore, the patient was informed of those risks and complications associated with the medications. These include, but are not limited to: allergic reactions (i.e.: anaphylactic or anaphylactoid reaction(s)); adrenal axis suppression; blood sugar elevation that in diabetics may result in ketoacidosis or comma; water retention that in patients with history of congestive heart failure may result in shortness of breath, pulmonary edema, and decompensation with resultant heart failure; weight gain; swelling or edema; medication-induced neural toxicity; particulate matter embolism and blood vessel occlusion with resultant organ, and/or nervous system infarction; and/or aseptic necrosis of one or more joints. Finally, the patient was informed that Medicine is not an exact science; therefore, there is also the possibility of unforeseen or unpredictable risks and/or possible complications that may result in a catastrophic outcome. The patient indicated having understood very clearly. We have given the patient no guarantees and we have made no promises. Enough time was given to the patient to ask questions, all of which were answered to the patient's satisfaction. Gail [name] has indicated that she wanted to continue with the procedure. Attestation: I, the ordering provider, attest that I have discussed with the patient the benefits, risks, side-effects, alternatives, likelihood of achieving goals, and potential problems during recovery for the procedure that I have provided informed consent. Date  Time: 02/14/2024  9:23  AM   Pre-Procedure Preparation:  Monitoring: As per clinic protocol. Respiration, ETCO2, SpO2, BP, heart rate and rhythm monitor placed and checked for adequate function Safety Precautions: Patient was assessed for positional comfort and pressure points before starting the procedure. Time-out: I initiated and conducted the "Time-out" before starting the procedure, as per protocol. The patient was asked to participate by confirming the accuracy of the "Time Out" information. Verification of the correct person, site, and procedure were performed and confirmed by me, the nursing staff, and the patient. "Time-out" conducted as per Joint Commission's Universal Protocol (UP.01.01.01). Time: 1012 Start Time: 1012 hrs.  Description of Procedure:          Procedure Performed: Ultrasound-Guided LEFT Interscalene Block for Thoracic Outlet Syndrome Indication: Suspected neurogenic thoracic outlet syndrome with shoulder and upper extremity pain, paresthesia, and/or functional limitation, for diagnostic and therapeutic purposes.  The patient was positioned supine with the head turned contralaterally. The neck was prepped and draped in a sterile fashion using chlorhexidine. A high-frequency linear ultrasound transducer was placed in the supraclavicular fossa and then moved cephalad to identify the interscalene groove between the anterior and middle scalene muscles, with visualization of the brachial plexus roots (C5-C7) in short-axis view.  Under continuous real-time ultrasound guidance, a 22-gauge echogenic PAJUNK needle was advanced in-plane from lateral to medial toward the perineural space around the brachial plexus roots. After negative aspiration, 20 cc of 0.2% ropivacaine was injected incrementally and 4 cc aliquots with careful observation of circumferential spread around the nerve roots.  The needle was withdrawn, and sterile dressing was applied. The patient tolerated the procedure well without  immediate complications.  Vitals:   02/14/24 1024 02/14/24 1029 02/14/24 1039 02/14/24 1050  BP: (!) 84/58 (!) 84/58 (!) 97/53 99/64  Pulse:      Resp: 18 16 14 16   Temp:   (!) 97 F (36.1 C)   TempSrc:   Temporal   SpO2: 100% 100% 100% 100%  Weight:  Height:        Start Time: 1012 hrs. End Time: 1035 hrs. Materials:    Antibiotic Prophylaxis:   Anti-infectives (From admission, onward)    None      Indication(s): None identified  Post-operative Assessment:  Post-procedure Vital Signs:  Pulse/HCG Rate: 6578 Temp: (!) 97 F (36.1 C) Resp: 16 BP: 99/64 SpO2: 100 %  EBL: None  Complications: No immediate post-treatment complications observed by team, or reported by patient.  Note: The patient tolerated the entire procedure well. A repeat set of vitals were taken after the procedure and the patient was kept under observation following institutional policy, for this type of procedure. Post-procedural neurological assessment was performed, showing return to baseline, prior to discharge. The patient was provided with post-procedure discharge instructions, including a section on how to identify potential problems. Should any problems arise concerning this procedure, the patient was given instructions to immediately contact us, at any time, without hesitation. In any case, we plan to contact the patient by telephone for a follow-up status report regarding this interventional procedure.  Comments:  No additional relevant information.  The patient was advised to monitor for temporary weakness, numbness, or heaviness in the arm, which are expected effects. Advised on fall precautions due to possible transient motor blockade. Instructed to report any prolonged weakness, shortness of breath (phrenic nerve involvement), or signs of infection.  Plan of Care (POC)  Orders:  No orders of the defined types were placed in this encounter.    Medications ordered for  procedure: Meds ordered this encounter  Medications   lidocaine (XYLOCAINE) 2 % (with pres) injection 400 mg   midazolam (VERSED) injection 0.5-2 mg    Make sure Flumazenil is available in the pyxis when using this medication. If oversedation occurs, administer 0.2 mg IV over 15 sec. If after 45 sec no response, administer 0.2 mg again over 1 min; may repeat at 1 min intervals; not to exceed 4 doses (1 mg)   lactated ringers infusion   ropivacaine (PF) 2 mg/mL (0.2%) (NAROPIN) injection 20 mL   Medications administered: We administered lidocaine, midazolam, lactated ringers, and ropivacaine (PF) 2 mg/mL (0.2%).  See the medical record for exact dosing, route, and time of administration.  Follow-up plan:   Return in about 3 weeks (around 03/06/2024) for 2 - 3 weeks PPE F2F .       Left interscalene brachial plexus nerve block 02/14/2024    Recent Visits Date Type Provider Dept  01/09/24 Office Visit Edward Jolly, MD [facility]  12/07/23 Office Visit Edward Jolly, MD [facility]  Showing recent visits within past 90 days and meeting all other requirements Today's Visits Date Type Provider Dept  02/14/24 Procedure visit Edward Jolly, MD [facility]  Showing today's visits and meeting all other requirements Future Appointments Date Type Provider Dept  03/06/24 Appointment Edward Jolly, MD [facility]  Showing future appointments within next 90 days and meeting all other requirements  Disposition: Discharge home  Discharge (Date  Time): 02/14/2024; 1108 hrs.   Primary Care Physician: Ardyth Man, MD Location: [facility] Note by: Edward Jolly, MD (TTS technology used. I apologize for any typographical errors that were not detected and corrected.) Date: 02/14/2024; Time: 3:20 PM  Disclaimer:  Medicine is not an Visual merchandiser. The only guarantee in medicine is that nothing is guaranteed. It is important to note  that the decision to proceed with this intervention was based on  the information collected from the patient. The Data and conclusions were drawn from the patient's questionnaire, the interview, and the physical examination. Because the information was provided in large part by the patient, it cannot be guaranteed that it has not been purposely or unconsciously manipulated. Every effort has been made to obtain as much relevant data as possible for this evaluation. It is important to note that the conclusions that lead to this procedure are derived in large part from the available data. Always take into account that the treatment will also be dependent on availability of resources and existing treatment guidelines, considered by other Pain Management Practitioners as being common knowledge and practice, at the time of the intervention. For Medico-Legal purposes, it is also important to point out that variation in procedural techniques and pharmacological choices are the acceptable norm. The indications, contraindications, technique, and results of the above procedure should only be interpreted and judged by a Board-Certified Interventional Pain Specialist with extensive familiarity and expertise in the same exact procedure and technique.

## 2024-02-15 ENCOUNTER — Telehealth: Payer: Self-pay | Admitting: Student in an Organized Health Care Education/Training Program

## 2024-02-15 ENCOUNTER — Telehealth: Payer: Self-pay | Admitting: *Deleted

## 2024-02-15 NOTE — Telephone Encounter (Signed)
 Returning patient phone call, reports facial flushing and burning, explained steroid rational.  States she had good pain relief in the left neck,shoulder and arm.  Medications that were used during procedure explained. Patient had multiple questions re; procedure and after care.  Same answered and patient verbalizes u/o information.

## 2024-02-15 NOTE — Telephone Encounter (Signed)
 Post procedure call; voicemail left

## 2024-02-15 NOTE — Telephone Encounter (Signed)
 PT called stated that her left arm is hurts. And she has some questions that she will like to ask. Please give patient a call.TY

## 2024-02-21 ENCOUNTER — Encounter: Payer: Self-pay | Admitting: Vascular Surgery

## 2024-02-21 ENCOUNTER — Ambulatory Visit: Payer: BC Managed Care – PPO | Admitting: Vascular Surgery

## 2024-02-21 VITALS — BP 101/68 | HR 74 | Temp 98.5°F | Ht 64.0 in | Wt 123.0 lb

## 2024-02-21 DIAGNOSIS — G540 Brachial plexus disorders: Secondary | ICD-10-CM
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
## 2024-03-04 ENCOUNTER — Ambulatory Visit: Payer: 59 | Admitting: Adult Health

## 2024-03-06 ENCOUNTER — Ambulatory Visit
Attending: Student in an Organized Health Care Education/Training Program | Admitting: Student in an Organized Health Care Education/Training Program

## 2024-03-06 ENCOUNTER — Encounter: Payer: Self-pay | Admitting: Student in an Organized Health Care Education/Training Program

## 2024-03-06 VITALS — BP 108/74 | HR 68 | Temp 97.3°F | Resp 16 | Ht 61.0 in | Wt 123.0 lb

## 2024-03-06 DIAGNOSIS — M542 Cervicalgia: Secondary | ICD-10-CM | POA: Insufficient documentation

## 2024-03-06 DIAGNOSIS — G540 Brachial plexus disorders: Secondary | ICD-10-CM | POA: Insufficient documentation

## 2024-03-06 DIAGNOSIS — M79602 Pain in left arm: Secondary | ICD-10-CM | POA: Diagnosis not present

## 2024-03-06 NOTE — Progress Notes (Signed)
 PROVIDER NOTE: Interpretation of information contained herein should be left to medically-trained personnel. Specific patient instructions are provided elsewhere under "Patient Instructions" section of medical record. This document was created in part using AI and STT-dictation technology, any transcriptional errors that may result from this process are unintentional.  Patient: Gail [name]  Service: E/M   PCP: Ardyth Man, MD  DOB: [birth year]  DOS: 03/06/2024  Provider: Edward Jolly, MD  MRN: [id number]  Delivery: Face-to-face  Specialty: Interventional Pain Management  Type: Established Patient  Setting: Ambulatory outpatient facility  Specialty designation: 09  Referring Prov.: Park, Nettie Elm, MD  Location: Outpatient office facility       HPI  [name], a 53 y.o. 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 Ms. Nappi primary complain today is No chief complaint on file.   Pain Assessment: Severity of Chronic pain is reported as a 5 /10. Location: Neck Left/left side of neck/shoulder. Onset: More than a month ago. Quality: Aching, Constant, Nagging. Timing:  . Modifying factor(s): rest,. Vitals:  height is 5\' 1"  (1.549 m) and weight is 123 lb (55.8 kg). Her temperature is 97.3 F (36.3 C) (abnormal). Her blood pressure is 108/74 and her pulse is 68. Her respiration is 16 and oxygen saturation is 99%.  BMI: Estimated body mass index is 23.24 kg/m as calculated from the following:   Height as of this encounter: 5\' 1"  (1.549 m).   Weight as of this encounter: 123 lb (55.8 kg). Last encounter: 01/09/2024. Last procedure: 02/14/2024.  Reason for encounter: post-procedure evaluation and assessment.    Post-procedure evaluation     Procedure:          Anesthesia, Analgesia, Anxiolysis:  Left interscalene brachial plexus nerve block for thoracic outlet syndrome  Anesthesia: Local (1-2% Lidocaine)  Anxiolysis:  None  Sedation: Minimal IV Versed Guidance: Fluoroscopy           Position: Supine   1. 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 Left arm pain    NAS-11 Pain score:   Pre-procedure: 6 /10   Post-procedure: 0-No pain/10    Effectiveness:  Initial hour after procedure: 100 %  Subsequent 4-6 hours post-procedure: 100 %  Analgesia past initial 6 hours: 50 %  Ongoing improvement:  Analgesic:  50% for the first 2 weeks then pain and limited ROM returned    ROS  Constitutional: Denies any fever or chills Gastrointestinal: No reported hemesis, hematochezia, vomiting, or acute GI distress Musculoskeletal: Denies any acute onset joint swelling, redness, loss of ROM, or weakness Neurological:  Left arm pain numbness  Medication Review  Biotin, Carboxymethylcellulose Sodium, Coenzyme Q10, Sennosides, Vitamin D3, acetaminophen, and cyanocobalamin  History Review  Allergy: Ms. Barbour is allergic to lactose intolerance (gi) and other. Drug: Ms. Michie  reports no history of drug use. Alcohol:  reports no history of alcohol use. Tobacco:  reports that she has never smoked. She has never been exposed to tobacco smoke. She has never used smokeless tobacco. Social: Ms. Hannan  reports that she has never smoked. She has never been exposed to tobacco smoke. She has never used smokeless tobacco. She reports that she does not drink alcohol and does not use drugs. Medical:  has a past medical history of Chest pain (2023), Chronic left shoulder pain, Fibromyalgia, GERD (gastroesophageal reflux disease), Headache, IBS (irritable bowel syndrome), Lupus, Mood changes, Neck pain, Pneumonia, Post concussion  syndrome, Sjogren's disease (HCC), Tinnitus, Trigger finger of left hand, Vitamin B12 deficiency, Vitamin D deficiency, and Wears glasses. Surgical: Ms. Vejar  has a past surgical history that includes Appendectomy (1988); Cholecystectomy; Colonoscopy; Upper gastrointestinal  endoscopy; Colonoscopy with propofol (N/A, 08/26/2020); polypectomy (08/26/2020); Esophagogastroduodenoscopy (egd) with propofol (N/A, 11/18/2022); and biopsy (11/18/2022). Family: family history includes Arthritis in her father; Cancer in her maternal uncle; Diabetes in her paternal uncle; Heart disease in her mother; Hyperlipidemia in her father; Other in her mother.  Laboratory Chemistry Profile   Renal Lab Results  Component Value Date   BUN 14 09/02/2023   CREATININE 0.42 (L) 09/02/2023   GFRNONAA >60 09/02/2023    Hepatic Lab Results  Component Value Date   AST 41 10/08/2022   ALT 49 (H) 10/08/2022   ALBUMIN 4.4 10/08/2022   ALKPHOS 62 10/08/2022    Electrolytes Lab Results  Component Value Date   NA 138 09/02/2023   K 4.0 09/02/2023   CL 107 09/02/2023   CALCIUM 8.4 (L) 09/02/2023    Bone No results found for: "VD25OH", "VD125OH2TOT", "WG9562ZH0", "QM5784ON6", "25OHVITD1", "25OHVITD2", "25OHVITD3", "TESTOFREE", "TESTOSTERONE"  Inflammation (CRP: Acute Phase) (ESR: Chronic Phase) No results found for: "CRP", "ESRSEDRATE", "LATICACIDVEN"       Note: Above Lab results reviewed.  Recent Imaging Review  Sleep Study Documents Ordered by an unspecified provider. Note: Reviewed        Physical Exam  General appearance: Well nourished, well developed, and well hydrated. In no apparent acute distress Mental status: Alert, oriented x 3 (person, place, & time)       Respiratory: No evidence of acute respiratory distress Eyes: PERLA Vitals: BP 108/74   Pulse 68   Temp (!) 97.3 F (36.3 C)   Resp 16   Ht 5\' 1"  (1.549 m)   Wt 123 lb (55.8 kg)   SpO2 99%   BMI 23.24 kg/m  BMI: Estimated body mass index is 23.24 kg/m as calculated from the following:   Height as of this encounter: 5\' 1"  (1.549 m).   Weight as of this encounter: 123 lb (55.8 kg). Ideal: Ideal body weight: 47.8 kg (105 lb 6.1 oz) Adjusted ideal body weight: 51 kg (112 lb 6.8 oz)  Assessment    Diagnosis Status  1. 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 Left arm pain   3. Cervicalgia    Controlled Controlled Controlled   Updated Problems: Problem  Cervicalgia    Plan of Care  Positive response to left interscalene brachial plexus block for diagnostic workup of left neurogenic thoracic outlet syndrome.  Patient notes 100% pain relief for the first 3 to 4 days and notes improvement in her ability to sleep.  She states that thereafter, the pain returned but she did experience about 50% pain relief for approximately 3 weeks.  She has seen Dr. Randie Heinz with vascular surgery and he has offered her a first rib resection for left neurogenic thoracic outlet syndrome.  She states that she is still thinking about this but will let Dr. Randie Heinz know if and when she would like to proceed with surgery.  She can follow-up with me as needed.   Follow-up plan:   Return for patient will call to schedule F2F appt prn.     Left interscalene brachial plexus nerve block 02/14/2024    Recent Visits Date Type Provider Dept  02/14/24 Procedure visit Edward Jolly, MD [facility]  01/09/24 Office Visit Edward Jolly, MD [facility]  12/07/23 Office Visit Edward Jolly, MD [facility]  Showing recent visits within past 90 days and meeting all other requirements Today's Visits Date Type Provider Dept  03/06/24 Office Visit Edward Jolly, MD [facility]  Showing today's visits and meeting all other requirements Future Appointments No visits were found meeting these conditions. Showing future appointments within next 90 days and meeting all other requirements  I discussed the assessment and treatment plan with the patient. The patient was provided an opportunity to ask questions and all were answered. The patient agreed with the plan and demonstrated an understanding of the instructions.  Patient advised to call back or seek an  in-person evaluation if the symptoms or condition worsens.  Duration of encounter: .  Total time on encounter, as per AMA guidelines included both the face-to-face and non-face-to-face time personally spent by the physician and/or other qualified health care professional(s) on the day of the encounter (includes time in activities that require the physician or other qualified health care professional and does not include time in activities normally performed by clinical staff). Physician's time may include the following activities when performed: Preparing to see the patient (e.g., pre-charting review of records, searching for previously ordered imaging, lab work, and nerve conduction tests) Review of prior analgesic pharmacotherapies. Reviewing PMP Interpreting ordered tests (e.g., lab work, imaging, nerve conduction tests) Performing post-procedure evaluations, including interpretation of diagnostic procedures Obtaining and/or reviewing separately obtained history Performing a medically appropriate examination and/or evaluation Counseling and educating the patient/family/caregiver Ordering medications, tests, or procedures Referring and communicating with other health care professionals (when not separately reported) Documenting clinical information in the electronic or other health record Independently interpreting results (not separately reported) and communicating results to the patient/ family/caregiver Care coordination (not separately reported)  Note by: Edward Jolly, MD (TTS and AI technology used. I apologize for any typographical errors that were not detected and corrected.) Date: 03/06/2024; Time: 4:13 PM

## 2024-03-06 NOTE — Progress Notes (Signed)
 Safety precautions to be maintained throughout the outpatient stay will include: orient to surroundings, keep bed in low position, maintain call bell within reach at all times, provide assistance with transfer out of bed and ambulation.

## 2024-03-26 ENCOUNTER — Telehealth: Payer: Self-pay

## 2024-03-26 ENCOUNTER — Ambulatory Visit: Attending: Nurse Practitioner | Admitting: Nurse Practitioner

## 2024-03-26 ENCOUNTER — Encounter: Payer: Self-pay | Admitting: Nurse Practitioner

## 2024-03-26 VITALS — BP 100/68 | HR 63 | Ht 61.0 in

## 2024-03-26 DIAGNOSIS — F4310 Post-traumatic stress disorder, unspecified: Secondary | ICD-10-CM

## 2024-03-26 DIAGNOSIS — F419 Anxiety disorder, unspecified: Secondary | ICD-10-CM

## 2024-03-26 DIAGNOSIS — R079 Chest pain, unspecified: Secondary | ICD-10-CM | POA: Diagnosis not present

## 2024-03-26 DIAGNOSIS — R400 Somnolence: Secondary | ICD-10-CM

## 2024-03-26 DIAGNOSIS — R002 Palpitations: Secondary | ICD-10-CM | POA: Diagnosis not present

## 2024-03-26 MED ORDER — PROPRANOLOL HCL 10 MG PO TABS: ORAL_TABLET | ORAL | 3 refills | Status: DC
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
## 2024-03-26 NOTE — Patient Instructions (Signed)
 Medication Instructions:  Propranolol 10 mg daily as needed for palpitations.   *If you need a refill on your cardiac medications before your next appointment, please call your pharmacy*  Lab Work: NONE ordered at this time of appointment   Testing/Procedures: NONE ordered at this time of appointment   Follow-Up: At [facility], you and your health needs are our priority.  As part of our continuing mission to provide you with exceptional heart care, our providers are all part of one team.  This team includes your primary Cardiologist (physician) and Advanced Practice Providers or APPs (Physician Assistants and Nurse Practitioners) who all work together to provide you with the care you need, when you need it.  Your next appointment:   2-3 month(s)  Provider:   Alexandria Angel, MD or Marlana Silvan, NP          We recommend signing up for the patient portal called "MyChart".  Sign up information is provided on this After Visit Summary.  MyChart is used to connect with patients for Virtual Visits (Telemedicine).  Patients are able to view lab/test results, encounter notes, upcoming appointments, etc.  Non-urgent messages can be sent to your provider as well.   To learn more about what you can do with MyChart, go to [email or web address].   Other Instructions

## 2024-03-26 NOTE — Telephone Encounter (Signed)
 Pt returned my call and does not wish to proceed with scheduling surgery at this time. She will call us  when/if she is ready.

## 2024-03-26 NOTE — Telephone Encounter (Signed)
 Called pt to schedule surgery. Left VM for her to return our call.

## 2024-03-27 ENCOUNTER — Encounter: Payer: Self-pay | Admitting: Nurse Practitioner

## 2024-04-16 ENCOUNTER — Ambulatory Visit (INDEPENDENT_AMBULATORY_CARE_PROVIDER_SITE_OTHER): Admitting: Otolaryngology

## 2024-04-16 ENCOUNTER — Encounter (INDEPENDENT_AMBULATORY_CARE_PROVIDER_SITE_OTHER): Payer: Self-pay | Admitting: Otolaryngology

## 2024-04-16 DIAGNOSIS — H9313 Tinnitus, bilateral: Secondary | ICD-10-CM
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
## 2024-04-18 ENCOUNTER — Ambulatory Visit: Payer: Self-pay | Admitting: Neurology

## 2024-04-18 ENCOUNTER — Encounter: Payer: Self-pay | Admitting: Neurology

## 2024-04-18 VITALS — BP 120/78 | HR 88 | Ht 61.0 in | Wt 120.0 lb

## 2024-04-18 DIAGNOSIS — H814 Vertigo of central origin: Secondary | ICD-10-CM | POA: Insufficient documentation

## 2024-04-18 MED ORDER — ALPRAZOLAM 0.5 MG PO TABS: 0.5000 mg | ORAL_TABLET | Freq: Every evening | ORAL | 0 refills | Status: DC | PRN
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
## 2024-04-23 ENCOUNTER — Ambulatory Visit: Admitting: Licensed Clinical Social Worker

## 2024-04-23 ENCOUNTER — Telehealth: Payer: Self-pay | Admitting: Neurology

## 2024-04-23 DIAGNOSIS — F4310 Post-traumatic stress disorder, unspecified: Secondary | ICD-10-CM | POA: Diagnosis not present
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
## 2024-04-23 NOTE — Telephone Encounter (Signed)
 no auth required sent to [facility] [phone]

## 2024-04-24 ENCOUNTER — Telehealth: Payer: Self-pay

## 2024-04-24 NOTE — Telephone Encounter (Signed)
 Letter sent to patient re: scheduling L 1st rib resection with Dr. Vikki Graves

## 2024-05-07 ENCOUNTER — Ambulatory Visit: Admitting: Licensed Clinical Social Worker

## 2024-05-24 ENCOUNTER — Ambulatory Visit (INDEPENDENT_AMBULATORY_CARE_PROVIDER_SITE_OTHER): Admitting: Licensed Clinical Social Worker

## 2024-05-24 DIAGNOSIS — F4310 Post-traumatic stress disorder, unspecified: Secondary | ICD-10-CM
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
## 2024-05-29 ENCOUNTER — Ambulatory Visit: Admitting: Nurse Practitioner

## 2024-06-05 ENCOUNTER — Telehealth: Payer: Self-pay
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
## 2024-06-10 ENCOUNTER — Telehealth (INDEPENDENT_AMBULATORY_CARE_PROVIDER_SITE_OTHER): Payer: Self-pay

## 2024-06-10 NOTE — Telephone Encounter (Signed)
 PA for IBSRELA  received. I called the patient to make sure she was no longer taking this and she confirms she is no longer taking the IBSRELA . PA discontinued by stating Treatment changed.

## 2024-06-10 NOTE — Telephone Encounter (Signed)
 Spoke to patient, Verbalized  understanding, will call with any further questions or concerns.

## 2024-06-14 ENCOUNTER — Ambulatory Visit: Admitting: Licensed Clinical Social Worker

## 2024-06-21 ENCOUNTER — Ambulatory Visit: Attending: Nurse Practitioner | Admitting: Nurse Practitioner

## 2024-06-21 ENCOUNTER — Ambulatory Visit

## 2024-06-21 VITALS — BP 105/61 | HR 78 | Ht 61.0 in | Wt 120.6 lb

## 2024-06-21 DIAGNOSIS — R0789 Other chest pain: Secondary | ICD-10-CM

## 2024-06-21 DIAGNOSIS — R42 Dizziness and giddiness: Secondary | ICD-10-CM

## 2024-06-21 DIAGNOSIS — R002 Palpitations: Secondary | ICD-10-CM | POA: Diagnosis not present

## 2024-06-21 DIAGNOSIS — R400 Somnolence: Secondary | ICD-10-CM

## 2024-06-21 DIAGNOSIS — F419 Anxiety disorder, unspecified: Secondary | ICD-10-CM | POA: Diagnosis not present

## 2024-06-21 DIAGNOSIS — R1310 Dysphagia, unspecified: Secondary | ICD-10-CM

## 2024-06-21 NOTE — Patient Instructions (Signed)
 Medication Instructions:  Your physician recommends that you continue on your current medications as directed. Please refer to the Current Medication list given to you today.  *If you need a refill on your cardiac medications before your next appointment, please call your pharmacy*  Lab Work: NONE ordered at this time of appointment   Testing/Procedures: ZIO XT- Long Term Monitor Instructions  Your physician has requested you wear a ZIO patch monitor for 14 days.  This is a single patch monitor. [employer] supplies one patch monitor per enrollment. Additional stickers are not available. Please do not apply patch if you will be having a Nuclear Stress Test,  Echocardiogram, Cardiac CT, MRI, or Chest Xray during the period you would be wearing the  monitor. The patch cannot be worn during these tests. You cannot remove and re-apply the  ZIO XT patch monitor.  Your ZIO patch monitor will be mailed 3 day USPS to your address on file. It may take 3-5 days  to receive your monitor after you have been enrolled.  Once you have received your monitor, please review the enclosed instructions. Your monitor  has already been registered assigning a specific monitor serial # to you.  Billing and Patient Assistance Program Information  We have supplied [employer] with any of your insurance information on file for billing purposes. [employer] offers a sliding scale Patient Assistance Program for patients that do not have  insurance, or whose insurance does not completely cover the cost of the ZIO monitor.  You must apply for the Patient Assistance Program to qualify for this discounted rate.  To apply, please call [employer] at [phone], select option 4, select option 2, ask to apply for  Patient Assistance Program. Meredeth will ask your household income, and how many people  are in your household. They will quote your out-of-pocket cost based on that information.  [employer] will also be able to set up a  21-month, interest-free payment plan if needed.  Applying the monitor   Shave hair from upper left chest.  Hold abrader disc by orange tab. Rub abrader in 40 strokes over the upper left chest as  indicated in your monitor instructions.  Clean area with 4 enclosed alcohol pads. Let dry.  Apply patch as indicated in monitor instructions. Patch will be placed under collarbone on left  side of chest with arrow pointing upward.  Rub patch adhesive wings for 2 minutes. Remove white label marked 1. Remove the white  label marked 2. Rub patch adhesive wings for 2 additional minutes.  While looking in a mirror, press and release button in center of patch. A small green light will  flash 3-4 times. This will be your only indicator that the monitor has been turned on.  Do not shower for the first 24 hours. You may shower after the first 24 hours.  Press the button if you feel a symptom. You will hear a small click. Record Date, Time and  Symptom in the Patient Logbook.  When you are ready to remove the patch, follow instructions on the last 2 pages of Patient  Logbook. Stick patch monitor onto the last page of Patient Logbook.  Place Patient Logbook in the blue and white box. Use locking tab on box and tape box closed  securely. The blue and white box has prepaid postage on it. Please place it in the mailbox as  soon as possible. Your physician should have your test results approximately 7 days after the  monitor has been  mailed back to [facility].  Call [facility] Customer Care at [phone] if you have questions regarding  your ZIO XT patch monitor. Call them immediately if you see an orange light blinking on your  monitor.  If your monitor falls off in less than 4 days, contact our Monitor department at [phone].  If your monitor becomes loose or falls off after 4 days call [employer] at [phone] for  suggestions on securing your monitor   Follow-Up: At [facility], you and your health needs are our priority.  As part of our continuing mission to provide you with exceptional heart care, our providers are all part of one team.  This team includes your primary Cardiologist (physician) and Advanced Practice Providers or APPs (Physician Assistants and Nurse Practitioners) who all work together to provide you with the care you need, when you need it.  Your next appointment:   6-8 week(s)  Provider:   Redell Shallow, MD or Damien Braver, NP          We recommend signing up for the patient portal called MyChart.  Sign up information is provided on this After Visit Summary.  MyChart is used to connect with patients for Virtual Visits (Telemedicine).  Patients are able to view lab/test results, encounter notes, upcoming appointments, etc.  Non-urgent messages can be sent to your provider as well.   To learn more about what you can do with MyChart, go to [email or web address].
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
## 2024-06-21 NOTE — Progress Notes (Unsigned)
Enrolled patient for a 14 day Zio XT monitor to be mailed to patients home  [place] to read

## 2024-06-24 ENCOUNTER — Encounter: Payer: Self-pay | Admitting: Nurse Practitioner

## 2024-06-29 ENCOUNTER — Emergency Department (HOSPITAL_COMMUNITY)

## 2024-06-29 ENCOUNTER — Emergency Department (HOSPITAL_COMMUNITY)
Admission: EM | Admit: 2024-06-29 | Discharge: 2024-06-29 | Disposition: A | Discharge status: Home / Self Care | Attending: Emergency Medicine | Admitting: Emergency Medicine

## 2024-06-29 ENCOUNTER — Other Ambulatory Visit: Payer: Self-pay

## 2024-06-29 ENCOUNTER — Encounter (HOSPITAL_COMMUNITY): Payer: Self-pay

## 2024-06-29 DIAGNOSIS — E876 Hypokalemia: Secondary | ICD-10-CM | POA: Diagnosis not present

## 2024-06-29 DIAGNOSIS — R002 Palpitations: Secondary | ICD-10-CM | POA: Insufficient documentation

## 2024-06-29 DIAGNOSIS — R079 Chest pain, unspecified: Secondary | ICD-10-CM | POA: Diagnosis present

## 2024-06-29 DIAGNOSIS — G540 Brachial plexus disorders: Secondary | ICD-10-CM | POA: Insufficient documentation

## 2024-06-29 DIAGNOSIS — Z7982 Long term (current) use of aspirin: Secondary | ICD-10-CM | POA: Insufficient documentation

## 2024-06-29 LAB — BASIC METABOLIC PANEL WITH GFR
Anion gap: 11 (ref 5–15)
BUN: 16 mg/dL (ref 6–20)
CO2: 23 mmol/L (ref 22–32)
Calcium: 8.6 mg/dL — ABNORMAL LOW (ref 8.9–10.3)
Chloride: 102 mmol/L (ref 98–111)
Creatinine, Ser: 0.45 mg/dL (ref 0.44–1.00)
GFR, Estimated: >60 mL/min (ref >60)
Glucose, Bld: 105 mg/dL — ABNORMAL HIGH (ref 70–99)
Potassium: 3.3 mmol/L — ABNORMAL LOW (ref 3.5–5.1)
Sodium: 136 mmol/L (ref 135–145)

## 2024-06-29 LAB — CBC
HCT: 33.7 % — ABNORMAL LOW (ref 36.0–46.0)
Hemoglobin: 11.7 g/dL — ABNORMAL LOW (ref 12.0–15.0)
MCH: 31.8 pg (ref 26.0–34.0)
MCHC: 34.7 g/dL (ref 30.0–36.0)
MCV: 91.6 fL (ref 80.0–100.0)
Platelets: 244 K/uL (ref 150–400)
RBC: 3.68 MIL/uL — ABNORMAL LOW (ref 3.87–5.11)
RDW: 12.8 % (ref 11.5–15.5)
WBC: 4.6 K/uL (ref 4.0–10.5)
nRBC: 0 % (ref 0.0–0.2)

## 2024-06-29 LAB — TROPONIN I (HIGH SENSITIVITY)
Troponin I (High Sensitivity): <2 ng/L (ref <18)
Troponin I (High Sensitivity): <2 ng/L (ref <18)

## 2024-06-29 MED ORDER — POTASSIUM CHLORIDE ER 10 MEQ PO TBCR: 10.0000 meq | EXTENDED_RELEASE_TABLET | Freq: Every day | ORAL | 0 refills | Status: DC
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
## 2024-06-29 NOTE — ED Triage Notes (Signed)
 Pt stated that she woke up with left arm numbness and a headache which is an every morning occurrence. Pt stated that this morning, she was also having SOB due to chest tightness. Pt currently wearing a heart monitor for 2 weeks due to heart palpitations. Pt also wanted nurse to make note of her increased difficulty swallowing

## 2024-06-29 NOTE — Discharge Instructions (Addendum)
 All of your testing today was normal except for a slightly low potassium for which she will need to take a supplement for only 1 week  Please follow-up with your vascular surgeon to have further discussions about the thoracic outlet syndrome, we have not seen any abnormal rhythms from your heart, your EKG or your blood work were all otherwise reassuring.  Thank you for allowing us  to treat you in the emergency department today.  After reviewing your examination and potential testing that was done it appears that you are safe to go home.  I would like for you to follow-up with your doctor within the next several days, have them obtain your records and follow-up with them to review all potential tests and results from your visit.  If you should develop severe or worsening symptoms return to the emergency department immediately

## 2024-07-16 DIAGNOSIS — R002 Palpitations: Secondary | ICD-10-CM

## 2024-07-18 ENCOUNTER — Ambulatory Visit: Admitting: Neurology

## 2024-07-18 ENCOUNTER — Ambulatory Visit: Payer: Self-pay | Admitting: Nurse Practitioner

## 2024-07-22 ENCOUNTER — Other Ambulatory Visit: Payer: Self-pay | Admitting: Otolaryngology

## 2024-07-22 DIAGNOSIS — R1310 Dysphagia, unspecified: Secondary | ICD-10-CM

## 2024-08-14 ENCOUNTER — Telehealth: Payer: Self-pay | Admitting: Neurology

## 2024-08-14 NOTE — Telephone Encounter (Signed)
 [facility] cancellation

## 2024-08-15 ENCOUNTER — Ambulatory Visit: Admitting: Neurology

## 2024-08-16 ENCOUNTER — Ambulatory Visit
Admission: RE | Admit: 2024-08-16 | Discharge: 2024-08-16 | Disposition: A | Discharge status: Home / Self Care | Source: Ambulatory Visit | Attending: Otolaryngology | Admitting: Otolaryngology

## 2024-08-16 DIAGNOSIS — R1310 Dysphagia, unspecified: Secondary | ICD-10-CM | POA: Insufficient documentation
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
## 2024-08-29 ENCOUNTER — Ambulatory Visit: Attending: Nurse Practitioner | Admitting: Nurse Practitioner

## 2024-08-29 ENCOUNTER — Encounter: Payer: Self-pay | Admitting: Nurse Practitioner

## 2024-08-29 VITALS — BP 110/60 | HR 72 | Ht 61.0 in | Wt 121.4 lb

## 2024-08-29 DIAGNOSIS — R002 Palpitations: Secondary | ICD-10-CM | POA: Diagnosis not present

## 2024-08-29 DIAGNOSIS — R072 Precordial pain: Secondary | ICD-10-CM | POA: Diagnosis not present

## 2024-08-29 DIAGNOSIS — R400 Somnolence: Secondary | ICD-10-CM

## 2024-08-29 DIAGNOSIS — R0602 Shortness of breath: Secondary | ICD-10-CM | POA: Diagnosis not present

## 2024-08-29 DIAGNOSIS — R0789 Other chest pain: Secondary | ICD-10-CM

## 2024-08-29 DIAGNOSIS — R42 Dizziness and giddiness: Secondary | ICD-10-CM | POA: Diagnosis not present

## 2024-08-29 MED ORDER — METOPROLOL TARTRATE 100 MG PO TABS: 100.0000 mg | ORAL_TABLET | Freq: Once | ORAL | 0 refills | Status: DC
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
## 2024-08-29 NOTE — Patient Instructions (Signed)
 Medication Instructions:  Your physician recommends that you continue on your current medications as directed. Please refer to the Current Medication list given to you today.  *If you need a refill on your cardiac medications before your next appointment, please call your pharmacy*  Lab Work: TODAY:  BMET  If you have labs (blood work) drawn today and your tests are completely normal, you will receive your results only by: MyChart Message (if you have MyChart) OR A paper copy in the mail If you have any lab test that is abnormal or we need to change your treatment, we will call you to review the results.  Testing/Procedures: Your physician has requested that you have cardiac CT. Cardiac computed tomography (CT) is a painless test that uses an x-ray machine to take clear, detailed pictures of your heart. For further information please visit [email or web address] Please follow instruction sheet as BELOW:    Your cardiac CT will be scheduled at one of the below locations:   [facility] [address] [place], [place] [address] [phone] (Severe contrast allergies only)  OR   [facility] [address] [place], [place] [address] [phone]  OR   [facility] [address] [place], [place] [address] [phone]  OR   [facility] and Vascular Tower [address]  [place], [place] [address] [phone]  OR   [facility] [address] [place], [place] [phone]  If scheduled at [facility], please arrive at the [facility] and Children's Entrance (Entrance C2) of [facility] 30 minutes prior to test start time. You can use the FREE valet parking offered at entrance C (encouraged to control the heart rate for the test)  Proceed to the [facility] Radiology Department (first floor) to check-in and test prep.  All radiology patients and guests should use entrance C2 at [facility], accessed from [facility], even though the hospital's physical address listed is [address]  If scheduled at the Heart and Vascular Tower at [address], please enter the parking lot using the [address] entrance and use the FREE valet service at the patient drop-off area. Enter the building and check-in with registration on the main floor.  If scheduled at [facility], please arrive to the Heart and Vascular Center 15 mins early for check-in and test prep.  There is spacious parking and easy access to the radiology department from the [facility] Heart and Vascular entrance. Please enter here and check-in with the desk attendant.   If scheduled at [facility], please arrive 30 minutes early for check-in and test prep.  Please follow these instructions carefully (unless otherwise directed):  An IV will be required for this test and Nitroglycerin will be given.   On the Night Before the Test: Be sure to Drink plenty of water . Do not consume any caffeinated/decaffeinated beverages or chocolate 12 hours prior to your test. Do not take any antihistamines 12 hours prior to your test.   On the Day of the Test: Drink plenty of water  until 1 hour prior to the test. Do not eat any food 1 hour prior to test. You may take your regular medications prior to the test.  Take metoprolol (Lopressor) 100 MG  two hours prior to test. THIS HAS BEEN SENT TO [facility] Patients who wear a continuous glucose monitor MUST remove the device prior to scanning. FEMALES- please wear  underwire-free bra if available, avoid dresses & tight clothing  After the Test: Drink plenty of water . After receiving IV contrast, you may experience a mild flushed feeling. This is normal. On occasion, you may experience a mild rash up to 24 hours after the test. This is not dangerous. If this occurs, you can take Benadryl 25 mg, Zyrtec, Claritin, or Allegra and  increase your fluid intake. (Patients taking Tikosyn should avoid Benadryl, and may take Zyrtec, Claritin, or Allegra) If you experience trouble breathing, this can be serious. If it is severe call 911 IMMEDIATELY. If it is mild, please call our office.  We will call to schedule your test 2-4 weeks out understanding that some insurance companies will need an authorization prior to the service being performed.   For more information and frequently asked questions, please visit our website : [email or web address]  For non-scheduling related questions, please contact the cardiac imaging nurse navigator should you have any questions/concerns: Cardiac Imaging Nurse Navigators Direct Office Dial: [phone]   For scheduling needs, including cancellations and rescheduling, please call [place], [phone].   Follow-Up: At [facility], you and your health needs are our priority.  As part of our continuing mission to provide you with exceptional heart care, our providers are all part of one team.  This team includes your primary Cardiologist (physician) and Advanced Practice Providers or APPs (Physician Assistants and Nurse Practitioners) who all work together to provide you with the care you need, when you need it.  Your next appointment:   6 week(s)  Provider:   Redell Shallow, MD or Damien Braver, NP          We recommend signing up for the patient portal called MyChart.  Sign up information is provided on this After Visit Summary.  MyChart is used to connect with patients for Virtual Visits (Telemedicine).  Patients are able to view lab/test results, encounter notes, upcoming appointments, etc.  Non-urgent messages can be sent to your provider as well.   To learn more about what you can do with MyChart, go to [email or web address].   Other Instructions

## 2024-08-30 ENCOUNTER — Ambulatory Visit: Payer: Self-pay | Admitting: Nurse Practitioner

## 2024-08-30 LAB — BASIC METABOLIC PANEL WITH GFR
BUN/Creatinine Ratio: 26 — ABNORMAL HIGH (ref 9–23)
BUN: 14 mg/dL (ref 6–24)
CO2: 25 mmol/L (ref 20–29)
Calcium: 9.3 mg/dL (ref 8.7–10.2)
Chloride: 102 mmol/L (ref 96–106)
Creatinine, Ser: 0.54 mg/dL — ABNORMAL LOW (ref 0.57–1.00)
Glucose: 75 mg/dL (ref 70–99)
Potassium: 4.5 mmol/L (ref 3.5–5.2)
Sodium: 141 mmol/L (ref 134–144)
eGFR: 111 mL/min/1.73 (ref >59)

## 2024-09-02 ENCOUNTER — Encounter: Payer: Self-pay | Admitting: Nurse Practitioner

## 2024-09-03 ENCOUNTER — Ambulatory Visit: Admitting: Neurology

## 2024-09-04 NOTE — Telephone Encounter (Signed)
 Patient seen that her results were abnormal. Patient is requesting to speak with a nurse. Please advise.

## 2024-09-05 ENCOUNTER — Telehealth (HOSPITAL_COMMUNITY): Payer: Self-pay | Admitting: Emergency Medicine

## 2024-09-05 NOTE — Telephone Encounter (Signed)
 Reaching out to patient to offer assistance regarding upcoming cardiac imaging study; pt verbalizes understanding of appt date/time, parking situation and where to check in, pre-test NPO status and medications ordered, and verified current allergies; name and call back number provided for further questions should they arise [place]well [place] RN Navigator Cardiac Imaging [facility] [phone] office [phone] cell

## 2024-09-06 ENCOUNTER — Telehealth: Payer: Self-pay | Admitting: Student in an Organized Health Care Education/Training Program

## 2024-09-06 ENCOUNTER — Ambulatory Visit (HOSPITAL_COMMUNITY)
Admission: RE | Admit: 2024-09-06 | Discharge: 2024-09-06 | Disposition: A | Discharge status: Home / Self Care | Source: Ambulatory Visit | Attending: Nurse Practitioner | Admitting: Nurse Practitioner

## 2024-09-06 DIAGNOSIS — R0602 Shortness of breath: Secondary | ICD-10-CM | POA: Insufficient documentation

## 2024-09-06 DIAGNOSIS — R072 Precordial pain: Secondary | ICD-10-CM

## 2024-09-06 MED ORDER — NITROGLYCERIN 0.4 MG SL SUBL
0.8000 mg | SUBLINGUAL_TABLET | Freq: Once | SUBLINGUAL | Status: AC
  Administered 2024-09-06: 0.8 mg via SUBLINGUAL

## 2024-09-06 MED ORDER — IOHEXOL 350 MG/ML SOLN
100.0000 mL | Freq: Once | INTRAVENOUS | Status: AC | PRN
  Administered 2024-09-06: 100 mL via INTRAVENOUS

## 2024-09-06 NOTE — Telephone Encounter (Signed)
 Spoke with pt who reports she developed severe headache, nausea and tingling in her left arm after having CT with contrast today.  This has continued through the afternoon.  No current vital signs.  Pt has not taken any medication for pain relief.   Pt advised she should be further evaluated in urgent care or ED as soon as possible.  Pt verbalizes understanding and agrees with current plan.

## 2024-09-06 NOTE — Telephone Encounter (Signed)
 After having ct today, pt is having a reaction to the contrast. She is still having a severe headache. Please advise.     Pt c/o of Chest Pain: STAT if active CP, including tightness, pressure, jaw pain, radiating pain to shoulder/upper arm/back, CP unrelieved by Nitro. Symptoms reported of SOB, nausea, vomiting, sweating.  1. Are you having CP right now? Yes     2. Are you experiencing any other symptoms (ex. SOB, nausea, vomiting, sweating)? headache   3. Is your CP continuous or coming and going? Continuous    4. Have you taken Nitroglycerin? No    5. How long have you been experiencing CP? Today     6. If NO CP at time of call then end call with telling Pt to call back or call 911 if Chest pain returns prior to return call from triage team.  Call transferred.

## 2024-09-11 ENCOUNTER — Encounter (INDEPENDENT_AMBULATORY_CARE_PROVIDER_SITE_OTHER): Payer: Self-pay | Admitting: Gastroenterology

## 2024-09-11 NOTE — Telephone Encounter (Signed)
 Patient returned staff call regarding results.

## 2024-09-23 ENCOUNTER — Encounter: Payer: Self-pay | Admitting: Neurology

## 2024-09-23 ENCOUNTER — Ambulatory Visit: Admitting: Neurology

## 2024-09-23 VITALS — BP 116/74 | HR 84 | Ht 61.0 in | Wt 119.4 lb

## 2024-09-23 DIAGNOSIS — S134XXD Sprain of ligaments of cervical spine, subsequent encounter: Secondary | ICD-10-CM

## 2024-09-23 DIAGNOSIS — R4589 Other symptoms and signs involving emotional state: Secondary | ICD-10-CM

## 2024-09-23 DIAGNOSIS — M79602 Pain in left arm: Secondary | ICD-10-CM

## 2024-09-23 DIAGNOSIS — M542 Cervicalgia: Secondary | ICD-10-CM

## 2024-09-23 DIAGNOSIS — R0602 Shortness of breath: Secondary | ICD-10-CM

## 2024-09-23 DIAGNOSIS — G540 Brachial plexus disorders: Secondary | ICD-10-CM | POA: Diagnosis not present

## 2024-09-23 MED ORDER — TIZANIDINE HCL 4 MG PO TABS: 4.0000 mg | ORAL_TABLET | Freq: Every day | ORAL | 5 refills | Status: AC
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
## 2024-09-23 NOTE — Patient Instructions (Addendum)
 Cervical Radiculopathy  Cervical radiculopathy happens when a nerve in the neck (a cervical nerve) is pinched or bruised. This condition can happen because of an injury to the cervical spine (vertebrae) in the neck, or as part of the normal aging process. Pressure on the cervical nerves can cause pain or numbness that travels from the neck all the way down to the arm and fingers. This condition usually gets better with rest. Treatment may be needed if the condition does not improve. What are the causes? This condition may be caused by: A neck injury. A bulging (herniated) disk. Muscle spasms. Muscle tightness in the neck due to overuse. Arthritis. Breakdown or degeneration in the bones and joints of the spine (spondylosis) due to aging. Bone spurs that may develop near the cervical nerves. What are the signs or symptoms? Symptoms of this condition include: Pain. The pain may travel from the neck to the arm and hand. The pain can be severe or irritating. It may get worse when you move your neck. Numbness or tingling in your arm or hand. Weakness in the affected arm and hand, in severe cases. How is this diagnosed? This condition may be diagnosed based on your symptoms, your medical history, and a physical exam. You may also have tests, including: X-rays. CT scan. MRI. Electromyogram (EMG). Nerve conduction tests. How is this treated? In many cases, treatment is not needed for this condition. With rest, the condition usually gets better over time. If treatment is needed, options may include: Wearing a soft neck collar (cervical collar) for short periods of time. Doing physical therapy to strengthen your neck muscles. Taking medicines. These may include NSAIDs, such as ibuprofen , or oral corticosteroids. Having spinal injections, in severe cases. Having surgery. This may be needed if other treatments do not help. Different types of surgery may be done depending on the cause of this  condition. Follow these instructions at home: If you have a cervical collar: Wear it as told by your health care provider. Remove it only as told by your health care provider. Ask your health care provider if you can remove the cervical collar for cleaning and bathing. If you are allowed to remove the collar for cleaning or bathing: Follow instructions from your health care provider about how to remove the collar safely. Clean the collar by wiping it with mild soap and water  and drying it completely. Take out any removable pads in the collar every 1-2 days, and wash them by hand with soap and water . Let them air-dry completely before you put them back in the collar. Check your skin under the collar for irritation or sores. If you see any, tell your health care provider. Managing pain     Take over-the-counter and prescription medicines only as told by your health care provider. If directed, put ice on the affected area. To do this: If you have a soft neck collar, remove it as told by your health care provider. Put ice in a plastic bag. Place a towel between your skin and the bag. Leave the ice on for 20 minutes, 2-3 times a day. Remove the ice if your skin turns bright red. This is very important. If you cannot feel pain, heat, or cold, you have a greater risk of damage to the area. If applying ice does not help, you can try using heat. Use the heat source that your health care provider recommends, such as a moist heat pack or a heating pad. Place a towel between  your skin and the heat source. Leave the heat on for 20-30 minutes. Remove the heat if your skin turns bright red. This is especially important if you are unable to feel pain, heat, or cold. You have a greater risk of getting burned. Try a gentle neck and shoulder massage to help relieve symptoms. Activity Rest as needed. Return to your normal activities as told by your health care provider. Ask your health care provider what  activities are safe for you. Do stretching and strengthening exercises as told by your health care provider or your physical therapist. You may have to avoid lifting. Ask your health care provider how much you can safely lift. General instructions Use a flat pillow when you sleep. Do not drive while wearing a cervical collar. If you do not have a cervical collar, ask your health care provider if it is safe to drive while your neck heals. Ask your health care provider if the medicine prescribed to you requires you to avoid driving or using machinery. Do not use any products that contain nicotine or tobacco. These products include cigarettes, chewing tobacco, and vaping devices, such as e-cigarettes. If you need help quitting, ask your health care provider. Keep all follow-up visits. This is important. Contact a health care provider if: Your condition does not improve with treatment. Get help right away if: Your pain gets much worse and is not controlled with medicines. You have weakness or numbness in your hand, arm, face, or leg. You have a high fever. You have a stiff, rigid neck. You lose control of your bowels or your bladder (have incontinence). You have trouble with walking, balance, or speaking. Summary Cervical radiculopathy happens when a nerve in the neck is pinched or bruised. A nerve can get pinched from a bulging disk, arthritis, muscle spasms, or an injury to the neck. Symptoms include pain, tingling, or numbness radiating from the neck to the arm or hand. Weakness can also occur in severe cases. Treatment may include rest, wearing a cervical collar, and physical therapy. Medicines may be prescribed to help with pain. In severe cases, injections or surgery may be needed. This information is not intended to replace advice given to you by your health care provider. Make sure you discuss any questions you have with your health care provider. Document Revised: 05/20/2021 Document  Reviewed: 05/20/2021 Elsevier Patient Education  2024 [employer]  Tizanidine  Capsules or Tablets What is this medication? TIZANIDINE  (tye ZAN i deen) treats muscle spasms. It works by relaxing your muscles, which reduces muscle stiffness. It belongs to a group of medications called muscle relaxants. This medicine may be used for other purposes; ask your health care provider or pharmacist if you have questions. COMMON BRAND NAME(S): Zanaflex  What should I tell my care team before I take this medication? They need to know if you have any of these conditions: Kidney disease Liver disease Low blood pressure Mental health condition An unusual or allergic reaction to tizanidine , other medications, foods, dyes, or preservatives Pregnant or trying to get pregnant Breastfeeding How should I use this medication? Take this medication by mouth with water . Take it as directed on the prescription label. You can take it with or without food. You should always take it the same way. Keep taking this medication unless your care team tells you to stop. Stopping it too quickly can cause serious side effects. Talk to your care team about the use of this medication in children. Special care may be needed. People  over 21 years of age may have a stronger reaction and need a smaller dose. Overdosage: If you think you have taken too much of this medicine contact a poison control center or emergency room at once. NOTE: This medicine is only for you. Do not share this medicine with others. What if I miss a dose? If you miss a dose, take it as soon as you can. If it is almost time for your next dose, take only that dose. Do not take double or extra doses. What may interact with this medication? Do not take this medication with any of the following: Ciprofloxacin Fluvoxamine Opioid medications for cough Thiabendazole Viloxazine This medication may also interact with the  following: Acyclovir Alcohol Antihistamines for allergy, cough, and cold Baclofen Certain medications for anxiety or sleep Certain medications for blood pressure, heart disease, irregular heartbeat, such as amiodarone, mexiletine, propafenone, verapamil Certain medications for depression, such as amitriptyline, fluoxetine, sertraline  Certain medications for seizures, such as phenobarbital, primidone Cimetidine Clonidine Estrogen or progestin hormones Famotidine General anesthetics, such as halothane, isoflurane, methoxyflurane, propofol  Guanfacine Medications that relax muscles Methyldopa Opioid medications for pain Phenothiazines, such as chlorpromazine, prochlorperazine, thioridazine Ticlopidine Zileuton This list may not describe all possible interactions. Give your health care provider a list of all the medicines, herbs, non-prescription drugs, or dietary supplements you use. Also tell them if you smoke, drink alcohol, or use illegal drugs. Some items may interact with your medicine. What should I watch for while using this medication? Visit your care team for regular checks on your progress. Tell your care team if your symptoms do not start to get better or if they get worse. This medication may affect your coordination, reaction time, or judgment. Do not drive or operate machinery until you know how this medication affects you. Sit up or stand slowly to reduce the risk of dizzy or fainting spells. Drinking alcohol with this medication can increase the risk of these side effects. Your mouth may get dry. Chewing sugarless gum or sucking hard candy and drinking plenty of water  may help. Contact your care team if the problem does not go away or is severe. What side effects may I notice from receiving this medication? Side effects that you should report to your care team as soon as possible: Allergic reactions--skin rash, itching, hives, swelling of the face, lips, tongue, or throat CNS  depression--slow or shallow breathing, shortness of breath, feeling faint, dizziness, confusion, trouble staying awake Hallucinations Liver injury--right upper belly pain, loss of appetite, nausea, light-colored stool, dark yellow or brown urine, yellowing skin or eyes, unusual weakness or fatigue Low blood pressure--dizziness, feeling faint or lightheaded, blurry vision Side effects that usually do not require medical attention (report to your care team if they continue or are bothersome): Constipation Dizziness Drowsiness Dry mouth Fatigue This list may not describe all possible side effects. Call your doctor for medical advice about side effects. You may report side effects to FDA at 1-800-FDA-1088. Where should I keep my medication? Keep out of the reach of children and pets. Store between 15 and 30 degrees C (59 and 86 degrees F). Get rid of any unused medication after the expiration date. To get rid of medications that are no longer needed or have expired: Take the medication to a medication take-back program. Check with your pharmacy or law enforcement to find a location. If you cannot return the medication, check the label or package insert to see if the medication should be thrown out in  the garbage or flushed down the toilet. If you are not sure, ask your care team. If it is safe to put it in the trash, take the medication out of the container. Mix the medication with cat litter, dirt, coffee grounds, or other unwanted substance. Seal the mixture in a bag or container. Put it in the trash. NOTE: This sheet is a summary. It may not cover all possible information. If you have questions about this medicine, talk to your doctor, pharmacist, or health care provider.  2024 Elsevier/Gold Standard (2022-05-26 00:00:00)   Mindfulness-Based Stress Reduction: What to Know Mindfulness-based stress reduction (MBSR) is a mindfulness meditation program that normally takes place over 8 weeks. It  usually includes weekly group classes and daily exercises to do at home. What are the benefits of MBSR? Mindfulness meditation therapies, like MBSR, can change a person's brain and body in good ways, and make them healthier. MBSR can have many benefits, such as: Helping to lower stress hormones. Decreasing symptoms or helping to deal with symptoms of different conditions, like: Anxiety, which is feeling worried or nervous. Long-lasting pain. This is pain that lasts more than 3 months. Stress and worry. Trouble sleeping. Headaches, like migraines and tension headaches. Irritable bowel syndrome. Helping to handle stress from things you can't control, like: Long-term illnesses, especially if you have a lot of pain or other difficult symptoms. Big life events. Stress at work. Stress from taking care of someone else. Types of MBSR exercises Mindfulness. This is a common type of meditation. Meditation. It helps you focus your mind to feel calm and happy. It has two main parts: paying attention and accepting. Paying attention means focusing on what is happening right now. This usually means noticing your breathing, your thoughts, how your body feels, and your emotions. Accepting means noticing these feelings and sensations without judging them. Instead of reacting to these thoughts or feelings, you just observe them and let them pass. MSBR exercises include: Body scanning. This is a mindfulness exercise where you pay attention to how different parts of your body feel. You can do this while lying down or sitting up. Sitting meditations. In this exercise, you focus on something like your breathing. When your mind starts to wander, gently bring it back to your breath. Keep doing this every time you notice your mind wandering. Mindful movements. This exercise involves moving and stretching your body slowly while paying attention to how it feels. Mindful Tasks. This means paying attention to how  your body feels while doing things like walking or eating. Follow these instructions at home:  Find an in-person MBSR program or find a program that is online. Find a podcast or recording that provides guidance for MSBR exercises. Look for a therapist who knows how to use MBSR. Follow your treatment plan as told by your health care provider. This may include taking regular medicines and making changes to your diet or lifestyle. Where to find more information You can find more information about MBSR from: Your provider. Community-based meditation centers or programs. American Psychological Association at http://forbes-duran.com/. This information is not intended to replace advice given to you by your health care provider. Make sure you discuss any questions you have with your health care provider. Document Revised: 01/18/2024 Document Reviewed: 01/18/2024 Elsevier Patient Education  2025 [employer] Inc.   Arterial Thoracic Outlet Syndrome  Arterial thoracic outlet syndrome (TOS) is a condition that happens when the subclavian artery is squeezed or compressed. The subclavian artery is the artery  that carries blood from the heart to the arm and hand. To reach the arm, this artery must pass through the thoracic outlet, which is a tight space under the collarbone (clavicle) and above the top rib. There are different types of TOS. The arterial type is the rarest. Depending on which structures are affected, you may have symptoms on one or both sides of your body. What are the causes? This condition is often caused by having a cervical rib. This is an extra rib at the base of your neck that presses on your subclavian artery. Over time, this pressure may cause a clot to form inside the artery, or the artery may weaken and balloon outward (aneurysm). What increases the risk? In addition to having a cervical rib, other factors can make you more likely to develop this condition.  These include: Being female. Being overweight. Poor posture. A job or hobby that involves repeated movements with your arms over your head. A history of Ehlers-Danlos syndrome. What are the signs or symptoms? Symptoms can affect one or both sides of the body. Symptoms include: Pain and cramps in your arm or hand. Pale skin or a change in color of the skin on your hand and arm. Very cold hand or hands. Weak pulses at your wrist. Rarely, muscle loss in your hands. These symptoms may be worse when you hold your arms over your head. How is this diagnosed? This condition may be diagnosed based on: Your symptoms and a physical exam. You may be asked to hold your arms over your head and in other positions to see if your symptoms get worse. Tests to confirm the diagnosis and to find out the cause of your TOS. These may include: X-rays to look for a cervical rib or another problem in the ribs. An ultrasound. This test uses sound waves to create an image. A CT scan. An MRI scan. An angiogram or venogram. In these tests, X-rays are done after a dye is injected into an artery or vein. A pulse volume recording. This test measures the pulses in your wrists. How is this treated? This condition may be treated with surgery to: Remove the cervical rib. Remove a blood clot (thrombus). Repair an aneurysm. Treatment may also include: A procedure to open up the clotted artery and restore blood flow (angioplasty). Medicine, including blood thinners or blood clot dissolvers. Follow these instructions at home: Medicines If you are taking blood thinners: Talk with your health care provider before taking aspirin or NSAIDs. These medicines can raise your risk of bleeding. Take your medicines as told. Take them at the same time each day. Do not do things that could hurt or bruise you. Be careful to avoid falls. Wear an alert bracelet or carry a card that says you take blood thinners. Take over-the-counter  and prescription medicines only as told by your provider. Activity Do exercises as told by your provider or physical therapist. Do not lift anything that is heavier than 10 lb (4.5 kg) until your provider says that it is safe. Do not carry heavy bags over your shoulder or do repeated lifting of heavy objects over your head. Take breaks often if you work at a keyboard or do other work that involves repeated movements of your hands and arms. Stretch and rest your arms during these breaks. General instructions Stay at a healthy weight. Lose weight as told by your provider. Maintain good posture. Keep all follow-up visits. Your provider will check to see if your  symptoms are improving with treatment. Contact a health care provider if: You have pain, cramps, numbness, or tingling in your arm or hand. Your arm or hand often feels tired. Your arm turns a darker and different color than usual. Your hand feels cold. You have frequent headaches or neck pain. You have muscle loss in your hand. Get help right away if: You lose feeling in your arm or hand. You cannot move your fingers. Your fingers turn a dark color. This information is not intended to replace advice given to you by your health care provider. Make sure you discuss any questions you have with your health care provider. Document Revised: 09/09/2022 Document Reviewed: 09/09/2022 Elsevier Patient Education  2024 [employer].

## 2024-10-10 ENCOUNTER — Ambulatory Visit: Admitting: Neurology

## 2024-10-10 ENCOUNTER — Ambulatory Visit: Attending: Nurse Practitioner | Admitting: Nurse Practitioner

## 2024-10-10 ENCOUNTER — Encounter: Payer: Self-pay | Admitting: Nurse Practitioner

## 2024-10-10 VITALS — BP 120/78 | HR 83 | Ht 61.0 in | Wt 119.0 lb

## 2024-10-10 DIAGNOSIS — R42 Dizziness and giddiness: Secondary | ICD-10-CM | POA: Diagnosis not present

## 2024-10-10 DIAGNOSIS — R0789 Other chest pain: Secondary | ICD-10-CM | POA: Diagnosis not present

## 2024-10-10 DIAGNOSIS — R002 Palpitations: Secondary | ICD-10-CM

## 2024-10-10 DIAGNOSIS — R0602 Shortness of breath: Secondary | ICD-10-CM | POA: Diagnosis not present

## 2024-10-10 LAB — BASIC METABOLIC PANEL WITH GFR
BUN/Creatinine Ratio: 23 (ref 9–23)
BUN: 11 mg/dL (ref 6–24)
CO2: 25 mmol/L (ref 20–29)
Calcium: 9.4 mg/dL (ref 8.7–10.2)
Chloride: 102 mmol/L (ref 96–106)
Creatinine, Ser: 0.47 mg/dL — ABNORMAL LOW (ref 0.57–1.00)
Glucose: 77 mg/dL (ref 70–99)
Potassium: 4 mmol/L (ref 3.5–5.2)
Sodium: 141 mmol/L (ref 134–144)
eGFR: 115 mL/min/1.73 (ref >59)
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
## 2024-10-10 NOTE — Patient Instructions (Signed)
 Medication Instructions:  Your physician recommends that you continue on your current medications as directed. Please refer to the Current Medication list given to you today.  *If you need a refill on your cardiac medications before your next appointment, please call your pharmacy*  Lab Work: BMET today  Testing/Procedures: NONE ordered at this time of appointment   Follow-Up: At [facility], you and your health needs are our priority.  As part of our continuing mission to provide you with exceptional heart care, our providers are all part of one team.  This team includes your primary Cardiologist (physician) and Advanced Practice Providers or APPs (Physician Assistants and Nurse Practitioners) who all work together to provide you with the care you need, when you need it.  Your next appointment:   1 year(s)  Provider:   Redell Shallow, MD    We recommend signing up for the patient portal called MyChart.  Sign up information is provided on this After Visit Summary.  MyChart is used to connect with patients for Virtual Visits (Telemedicine).  Patients are able to view lab/test results, encounter notes, upcoming appointments, etc.  Non-urgent messages can be sent to your provider as well.   To learn more about what you can do with MyChart, go to [email or web address].

## 2024-10-13 ENCOUNTER — Encounter: Payer: Self-pay | Admitting: Nurse Practitioner

## 2024-10-15 ENCOUNTER — Ambulatory Visit: Admitting: Neurology

## 2024-10-16 ENCOUNTER — Ambulatory Visit: Payer: Self-pay | Admitting: Nurse Practitioner

## 2025-01-16 ENCOUNTER — Ambulatory Visit: Admitting: Neurology
